# Patient Record
Sex: Male | Born: 1975 | Race: Black or African American | Hispanic: No | Marital: Single | Smoking: Current every day smoker
Health system: Southern US, Community
[De-identification: ages and names within clinical notes are randomized; demographics above are authoritative.]

## PROBLEM LIST (undated history)

## (undated) DIAGNOSIS — Z8616 Personal history of COVID-19: Secondary | ICD-10-CM

## (undated) DIAGNOSIS — F1120 Opioid dependence, uncomplicated: Secondary | ICD-10-CM

## (undated) DIAGNOSIS — J45909 Unspecified asthma, uncomplicated: Secondary | ICD-10-CM

## (undated) HISTORY — PX: NO PAST SURGERIES: SHX2092

---

## 2009-09-01 ENCOUNTER — Emergency Department: Payer: Self-pay | Admitting: Unknown Physician Specialty

## 2009-09-02 ENCOUNTER — Emergency Department: Payer: Self-pay | Admitting: Emergency Medicine

## 2010-12-03 ENCOUNTER — Emergency Department: Payer: Self-pay | Admitting: Emergency Medicine

## 2011-01-19 ENCOUNTER — Emergency Department: Payer: Self-pay | Admitting: *Deleted

## 2014-09-18 ENCOUNTER — Other Ambulatory Visit: Payer: Self-pay

## 2014-09-18 ENCOUNTER — Emergency Department
Admission: EM | Admit: 2014-09-18 | Discharge: 2014-09-18 | Disposition: A | Discharge status: Home / Self Care | Payer: Self-pay | Attending: Emergency Medicine | Admitting: Emergency Medicine

## 2014-09-18 ENCOUNTER — Emergency Department: Payer: Self-pay

## 2014-09-18 DIAGNOSIS — R109 Unspecified abdominal pain: Secondary | ICD-10-CM

## 2014-09-18 DIAGNOSIS — R100 Acute abdomen: Secondary | ICD-10-CM | POA: Insufficient documentation

## 2014-09-18 DIAGNOSIS — R001 Bradycardia, unspecified: Secondary | ICD-10-CM

## 2014-09-18 DIAGNOSIS — Z720 Tobacco use: Secondary | ICD-10-CM | POA: Insufficient documentation

## 2014-09-18 LAB — COMPREHENSIVE METABOLIC PANEL
ALT: 18 U/L (ref 17–63)
AST: 38 U/L (ref 15–41)
Albumin: 4.3 g/dL (ref 3.5–5.0)
Alkaline Phosphatase: 40 U/L (ref 38–126)
Anion gap: 7 (ref 5–15)
BUN: 11 mg/dL (ref 6–20)
CALCIUM: 9.2 mg/dL (ref 8.9–10.3)
CHLORIDE: 106 mmol/L (ref 101–111)
CO2: 27 mmol/L (ref 22–32)
Creatinine, Ser: 1.14 mg/dL (ref 0.61–1.24)
GFR calc non Af Amer: >60 mL/min (ref >60)
Glucose, Bld: 89 mg/dL (ref 65–99)
Potassium: 4 mmol/L (ref 3.5–5.1)
Sodium: 140 mmol/L (ref 135–145)
Total Bilirubin: 0.9 mg/dL (ref 0.3–1.2)
Total Protein: 7.5 g/dL (ref 6.5–8.1)

## 2014-09-18 LAB — CBC WITH DIFFERENTIAL/PLATELET
BASOS PCT: 0 %
Basophils Absolute: 0 10*3/uL (ref 0–0.1)
EOS ABS: 0.1 10*3/uL (ref 0–0.7)
EOS PCT: 1 %
HEMATOCRIT: 42 % (ref 40.0–52.0)
Hemoglobin: 13.8 g/dL (ref 13.0–18.0)
LYMPHS ABS: 2.2 10*3/uL (ref 1.0–3.6)
Lymphocytes Relative: 27 %
MCH: 28.4 pg (ref 26.0–34.0)
MCHC: 32.9 g/dL (ref 32.0–36.0)
MCV: 86.4 fL (ref 80.0–100.0)
Monocytes Absolute: 0.7 10*3/uL (ref 0.2–1.0)
Monocytes Relative: 9 %
Neutro Abs: 5.1 10*3/uL (ref 1.4–6.5)
Neutrophils Relative %: 63 %
Platelets: 163 10*3/uL (ref 150–440)
RBC: 4.86 MIL/uL (ref 4.40–5.90)
RDW: 13.8 % (ref 11.5–14.5)
WBC: 8.1 10*3/uL (ref 3.8–10.6)

## 2014-09-18 LAB — URINALYSIS COMPLETE WITH MICROSCOPIC (ARMC ONLY)
Bacteria, UA: NONE SEEN
Bilirubin Urine: NEGATIVE
Glucose, UA: NEGATIVE mg/dL
KETONES UR: NEGATIVE mg/dL
Nitrite: NEGATIVE
Protein, ur: NEGATIVE mg/dL
Specific Gravity, Urine: 1.014 (ref 1.005–1.030)
Squamous Epithelial / LPF: NONE SEEN
pH: 6 (ref 5.0–8.0)

## 2014-09-18 LAB — LIPASE, BLOOD: LIPASE: 19 U/L — AB (ref 22–51)

## 2014-09-18 MED ORDER — METRONIDAZOLE 500 MG PO TABS: 500.0000 mg | ORAL_TABLET | Freq: Three times a day (TID) | ORAL | Status: DC

## 2014-09-18 MED ORDER — DICYCLOMINE HCL 20 MG PO TABS: 9.0000 mg | ORAL_TABLET | Freq: Three times a day (TID) | ORAL | Status: DC | PRN

## 2014-09-18 NOTE — ED Provider Notes (Addendum)
[facility] Emergency Department Provider Note     Time seen: ----------------------------------------- 12:13 PM on 09/18/2014 -----------------------------------------    I have reviewed the triage vital signs and the nursing notes.   HISTORY  Chief Complaint Abdominal Pain; Constipation; and Dysuria    HPI Russell Harrington. is a 39 y.o. male who presents ER with abdominal pain, states that yesterday started feeling constipated and he had a mucousy-like stool. States every time he goes to the bathroom he sees mucus or jelly like substance. He combines of some mild dull left sided abdominal pain, nothing makes better worse. No history of same, prior to this he has had normal bowel movements. Denies fevers chills or other complaints.   History reviewed. No pertinent past medical history.  There are no active problems to display for this patient.   History reviewed. No pertinent past surgical history.  Allergies Aspirin  Social History History  Substance Use Topics  . Smoking status: Current Every Day Smoker  . Smokeless tobacco: Not on file  . Alcohol Use: Yes    Review of Systems Constitutional: Negative for fever. Eyes: Negative for visual changes. ENT: Negative for sore throat. Cardiovascular: Negative for chest pain. Respiratory: Negative for shortness of breath. Gastrointestinal: Positive for abdominal pain, questionable diarrhea Genitourinary: Negative for dysuria. Musculoskeletal: Negative for back pain. Skin: Negative for rash. Neurological: Negative for headaches, focal weakness or numbness.  10-point ROS otherwise negative.  ____________________________________________   PHYSICAL EXAM:  VITAL SIGNS: ED Triage Vitals  Enc Vitals Group     BP 09/18/14 1053 135/90 mmHg     Pulse Rate 09/18/14 1053 50     Resp 09/18/14 1053 18     Temp 09/18/14 1053 98.2 F (36.8 C)     Temp Source 09/18/14 1053 Oral     SpO2 09/18/14  1053 100 %     Weight --      Height --      Head Cir --      Peak Flow --      Pain Score --      Pain Loc --      Pain Edu? --      Excl. in GC? --     Constitutional: Alert and oriented. Well appearing and in no distress. Eyes: Conjunctivae are normal. PERRL. Normal extraocular movements. ENT   Head: Normocephalic and atraumatic.   Nose: No congestion/rhinnorhea.   Mouth/Throat: Mucous membranes are moist.   Neck: No stridor. Hematological/Lymphatic/Immunilogical: No cervical lymphadenopathy. Cardiovascular: Normal rate, regular rhythm. Normal and symmetric distal pulses are present in all extremities. No murmurs, rubs, or gallops. Respiratory: Normal respiratory effort without tachypnea nor retractions. Breath sounds are clear and equal bilaterally. No wheezes/rales/rhonchi. Gastrointestinal: Soft and nontender. No distention. No abdominal bruits. There is no CVA tenderness. Musculoskeletal: Nontender with normal range of motion in all extremities. No joint effusions.  No lower extremity tenderness nor edema. Neurologic:  Normal speech and language. No gross focal neurologic deficits are appreciated. Speech is normal. No gait instability. Skin:  Skin is warm, dry and intact. No rash noted. Psychiatric: Mood and affect are normal. Speech and behavior are normal. Patient exhibits appropriate insight and judgment.  ____________________________________________  ED COURSE:  Pertinent labs & imaging results that were available during my care of the patient were reviewed by me and considered in my medical decision making (see chart for details). Patient looks well, he is in no acute distress. We'll check abdominal labs and plain films.  ____________________________________________    LABS (pertinent positives/negatives)  Labs Reviewed  LIPASE, BLOOD - Abnormal; Notable for the following:    Lipase 19 (*)    All other components within normal limits  URINALYSIS  COMPLETEWITH MICROSCOPIC ([facility] ONLY) - Abnormal; Notable for the following:    Color, Urine YELLOW (*)    APPearance CLEAR (*)    Hgb urine dipstick 1+ (*)    Leukocytes, UA 1+ (*)    All other components within normal limits  STOOL CULTURE  CBC WITH DIFFERENTIAL/PLATELET  COMPREHENSIVE METABOLIC PANEL   EKG: Sinus bradycardia with a rate of 38 bpm, early repolarization. Normal axis. Normal intervals. No evidence of acute infarction.  RADIOLOGY Images were viewed by me  Two-view abdomen FINDINGS: Scattered large and small bowel gas is noted. No free air is seen. Fecal material is noted throughout the colon. No abnormal mass or abnormal calcifications are noted. Phleboliths are seen within the pelvis. No acute bony abnormality is noted.  IMPRESSION: No acute abnormality seen. ____________________________________________  FINAL ASSESSMENT AND PLAN  Abdominal pain, bradycardia  Plan: Patient with labs and imaging as dictated above. Patient had a normal looking stool here, but we'll place him on Flagyl and encouraged close follow-up with a physician. His also noted to have bradycardia that is sinus bradycardia and he is asymptomatic from. Patient states he can exercise vigorously for 12 hours at a time and he doesn't have any dyspnea or chest pain.   Emily Filbert, MD   Emily Filbert, MD 09/18/14 1410  Emily Filbert, MD 09/18/14 [phone]

## 2014-09-18 NOTE — ED Notes (Signed)
Pt states that he is experiencing lower abdominal pain when he tries to urinate or defecate that began yesterday. States pain has "eased" off at this time. Constipation and dysuria. Pt alert and oriented X4, active, cooperative, pt in NAD. RR even and unlabored, color WNL.

## 2014-09-18 NOTE — Discharge Instructions (Signed)

## 2014-09-18 NOTE — ED Notes (Signed)
Pt states discomfort in his abdomen, pt in no distress, pt states yesterday he felt constipated and states "jelly" kept coming out, pt states last night he had a BM that was brown but weird in texture, no distended abdomen, pt denies any painful urination

## 2014-09-18 NOTE — ED Notes (Signed)
Patient transported to X-ray 

## 2014-09-20 LAB — STOOL CULTURE: Special Requests: NORMAL

## 2015-04-02 ENCOUNTER — Encounter: Payer: Self-pay | Admitting: Emergency Medicine

## 2015-04-02 ENCOUNTER — Emergency Department
Admission: EM | Admit: 2015-04-02 | Discharge: 2015-04-02 | Disposition: A | Discharge status: Home / Self Care | Payer: No Typology Code available for payment source | Attending: Emergency Medicine | Admitting: Emergency Medicine

## 2015-04-02 DIAGNOSIS — Z792 Long term (current) use of antibiotics: Secondary | ICD-10-CM | POA: Diagnosis not present

## 2015-04-02 DIAGNOSIS — Y9389 Activity, other specified: Secondary | ICD-10-CM | POA: Insufficient documentation

## 2015-04-02 DIAGNOSIS — S3992XA Unspecified injury of lower back, initial encounter: Secondary | ICD-10-CM | POA: Insufficient documentation

## 2015-04-02 DIAGNOSIS — Y998 Other external cause status: Secondary | ICD-10-CM | POA: Diagnosis not present

## 2015-04-02 DIAGNOSIS — Y92481 Parking lot as the place of occurrence of the external cause: Secondary | ICD-10-CM | POA: Diagnosis not present

## 2015-04-02 DIAGNOSIS — F17200 Nicotine dependence, unspecified, uncomplicated: Secondary | ICD-10-CM | POA: Diagnosis not present

## 2015-04-02 DIAGNOSIS — M545 Low back pain, unspecified: Secondary | ICD-10-CM

## 2015-04-02 MED ORDER — IBUPROFEN 800 MG PO TABS
800.0000 mg | ORAL_TABLET | Freq: Once | ORAL | Status: AC
  Administered 2015-04-02: 800 mg via ORAL
  Filled 2015-04-02: qty 1

## 2015-04-02 MED ORDER — TRAMADOL HCL 50 MG PO TABS
50.0000 mg | ORAL_TABLET | Freq: Once | ORAL | Status: AC
  Administered 2015-04-02: 50 mg via ORAL
  Filled 2015-04-02: qty 1

## 2015-04-02 MED ORDER — TRAMADOL HCL 50 MG PO TABS: 50.0000 mg | ORAL_TABLET | Freq: Four times a day (QID) | ORAL | Status: DC | PRN

## 2015-04-02 MED ORDER — IBUPROFEN 800 MG PO TABS: 800.0000 mg | ORAL_TABLET | Freq: Three times a day (TID) | ORAL | Status: DC | PRN

## 2015-04-02 NOTE — Discharge Instructions (Signed)
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
## 2015-05-04 ENCOUNTER — Emergency Department: Payer: No Typology Code available for payment source

## 2015-05-04 ENCOUNTER — Emergency Department
Admission: EM | Admit: 2015-05-04 | Discharge: 2015-05-04 | Disposition: A | Discharge status: Home / Self Care | Payer: No Typology Code available for payment source | Attending: Emergency Medicine | Admitting: Emergency Medicine

## 2015-05-04 DIAGNOSIS — Z792 Long term (current) use of antibiotics: Secondary | ICD-10-CM | POA: Insufficient documentation

## 2015-05-04 DIAGNOSIS — B349 Viral infection, unspecified: Secondary | ICD-10-CM | POA: Insufficient documentation

## 2015-05-04 DIAGNOSIS — A084 Viral intestinal infection, unspecified: Secondary | ICD-10-CM | POA: Insufficient documentation

## 2015-05-04 DIAGNOSIS — F17200 Nicotine dependence, unspecified, uncomplicated: Secondary | ICD-10-CM | POA: Insufficient documentation

## 2015-05-04 LAB — COMPREHENSIVE METABOLIC PANEL
ALBUMIN: 4.6 g/dL (ref 3.5–5.0)
ALT: 14 U/L — ABNORMAL LOW (ref 17–63)
AST: 22 U/L (ref 15–41)
Alkaline Phosphatase: 54 U/L (ref 38–126)
Anion gap: 8 (ref 5–15)
BUN: 12 mg/dL (ref 6–20)
CHLORIDE: 106 mmol/L (ref 101–111)
CO2: 25 mmol/L (ref 22–32)
Calcium: 9.4 mg/dL (ref 8.9–10.3)
Creatinine, Ser: 1.12 mg/dL (ref 0.61–1.24)
GFR calc Af Amer: >60 mL/min (ref >60)
GFR calc non Af Amer: >60 mL/min (ref >60)
GLUCOSE: 106 mg/dL — AB (ref 65–99)
POTASSIUM: 4.2 mmol/L (ref 3.5–5.1)
Sodium: 139 mmol/L (ref 135–145)
TOTAL PROTEIN: 8.3 g/dL — AB (ref 6.5–8.1)
Total Bilirubin: 1.2 mg/dL (ref 0.3–1.2)

## 2015-05-04 LAB — RAPID INFLUENZA A&B ANTIGENS (ARMC ONLY): INFLUENZA A (ARMC): NEGATIVE

## 2015-05-04 LAB — CBC
HEMATOCRIT: 46.6 % (ref 40.0–52.0)
HEMOGLOBIN: 15.3 g/dL (ref 13.0–18.0)
MCH: 27.6 pg (ref 26.0–34.0)
MCHC: 32.8 g/dL (ref 32.0–36.0)
MCV: 84.2 fL (ref 80.0–100.0)
Platelets: 192 10*3/uL (ref 150–440)
RBC: 5.53 MIL/uL (ref 4.40–5.90)
RDW: 13.8 % (ref 11.5–14.5)
WBC: 12.6 10*3/uL — ABNORMAL HIGH (ref 3.8–10.6)

## 2015-05-04 LAB — TROPONIN I: Troponin I: <0.03 ng/mL (ref <0.031)

## 2015-05-04 LAB — RAPID INFLUENZA A&B ANTIGENS: Influenza B (ARMC): NEGATIVE

## 2015-05-04 MED ORDER — ONDANSETRON HCL 4 MG/2ML IJ SOLN
4.0000 mg | Freq: Once | INTRAMUSCULAR | Status: AC
  Administered 2015-05-04: 4 mg via INTRAVENOUS
  Filled 2015-05-04: qty 2

## 2015-05-04 MED ORDER — HYDROMORPHONE HCL 1 MG/ML IJ SOLN
0.5000 mg | Freq: Once | INTRAMUSCULAR | Status: AC
  Administered 2015-05-04: 0.5 mg via INTRAVENOUS
  Filled 2015-05-04: qty 1

## 2015-05-04 MED ORDER — DICYCLOMINE HCL 20 MG PO TABS: 20.0000 mg | ORAL_TABLET | Freq: Three times a day (TID) | ORAL | Status: DC | PRN

## 2015-05-04 MED ORDER — PROMETHAZINE HCL 12.5 MG PO TABS: 12.5000 mg | ORAL_TABLET | Freq: Four times a day (QID) | ORAL | Status: DC | PRN

## 2015-05-04 MED ORDER — SODIUM CHLORIDE 0.9 % IV BOLUS (SEPSIS)
1000.0000 mL | Freq: Once | INTRAVENOUS | Status: AC
  Administered 2015-05-04: 1000 mL via INTRAVENOUS

## 2015-05-04 NOTE — ED Notes (Signed)
Pt arrives to ER via POV c/o URI sx, nasal congestion, chest congestion, diarrhea and chills. Pt states worsening sx today. Pt did not get flu shot this year. Pt states he knows a few people who have been sick recently. Pt alert and oriented X4, active, cooperative, pt in NAD. RR even and unlabored, color WNL.

## 2015-05-04 NOTE — ED Provider Notes (Signed)
Time Seen: Approximately 2128  I have reviewed the triage notes  Chief Complaint: Nasal Congestion; Diarrhea; URI; Chills; and Shortness of Breath   History of Present Illness: Russell MoraleBarney Turnipseed Jr. is a 40 y.o. male who complains of multiple symptoms mainly starting with some upper respiratory symptoms that started over the last 24 hours with nasal and chest congestion. Patient states he came to the emergency department because he had some generalized weakness after he had multiple loose watery stool. She is not aware of any high fevers or any bloody diarrhea. He states that he has not had any productive nature to a cough. He still feels nauseated. He is denies any recent travel or antibiotic therapy.   History reviewed. No pertinent past medical history.  There are no active problems to display for this patient.   History reviewed. No pertinent past surgical history.  History reviewed. No pertinent past surgical history.  Current Outpatient Rx  Name  Route  Sig  Dispense  Refill  . dicyclomine (BENTYL) 20 MG tablet   Oral   Take 1 tablet (20 mg total) by mouth 3 (three) times daily as needed for spasms.   30 tablet   0   . ibuprofen (ADVIL,MOTRIN) 800 MG tablet   Oral   Take 1 tablet (800 mg total) by mouth every 8 (eight) hours as needed.   30 tablet   0   . metroNIDAZOLE (FLAGYL) 500 MG tablet   Oral   Take 1 tablet (500 mg total) by mouth 3 (three) times daily.   21 tablet   0   . promethazine (PHENERGAN) 12.5 MG tablet   Oral   Take 1 tablet (12.5 mg total) by mouth every 6 (six) hours as needed for nausea or vomiting.   30 tablet   0   . traMADol (ULTRAM) 50 MG tablet   Oral   Take 1 tablet (50 mg total) by mouth every 6 (six) hours as needed for moderate pain.   12 tablet   0     Allergies:  Aspirin  Family History: No family history on file.  Social History: Social History  Substance Use Topics  . Smoking status: Current Every Day Smoker  .  Smokeless tobacco: None  . Alcohol Use: Yes     Review of Systems:   10 point review of systems was performed and was otherwise negative:  Constitutional: No fever Eyes: No visual disturbances ENT: No sore throat, ear pain Cardiac: Mild chest discomfort mainly with cough and points mainly around the sternal region Respiratory: No shortness of breath, wheezing, or stridor Abdomen: Diffuse crampy abdominal pain and points primarily to the umbilicus region. Endocrine: No weight loss, No night sweats Extremities: No peripheral edema, cyanosis Skin: No rashes, easy bruising Neurologic: No focal weakness, trouble with speech or swollowing Urologic: No dysuria, Hematuria, or urinary frequency   Physical Exam:  ED Triage Vitals  Enc Vitals Group     BP 05/04/15 2102 132/72 mmHg     Pulse Rate 05/04/15 2102 71     Resp 05/04/15 2102 18     Temp 05/04/15 2102 98.6 F (37 C)     Temp Source 05/04/15 2102 Oral     SpO2 05/04/15 2102 100 %     Weight 05/04/15 2102 170 lb (77.111 kg)     Height 05/04/15 2102 5\' 7"  (1.702 m)     Head Cir --      Peak Flow --  Pain Score 05/04/15 2103 10     Pain Loc --      Pain Edu? --      Excl. in GC? --     General: Awake , Alert , and Oriented times 3; GCS 15 Head: Normal cephalic , atraumatic Eyes: Pupils equal , round, reactive to light Nose/Throat: No nasal drainage, patent upper airway without erythema or exudate.  Neck: Supple, Full range of motion, No anterior adenopathy or palpable thyroid masses Lungs: Clear to ascultation without wheezes , rhonchi, or rales Heart: Regular rate, regular rhythm without murmurs , gallops , or rubs Abdomen: Very mild. Umbilical pain without without rebound, guarding , or rigidity; bowel sounds positive and symmetric in all 4 quadrants. No organomegaly .     No focal tenderness over McBurney's point   Extremities: 2 plus symmetric pulses. No edema, clubbing or cyanosis Neurologic: normal ambulation,  Motor symmetric without deficits, sensory intact Skin: warm, dry, no rashes   Labs:   All laboratory work was reviewed including any pertinent negatives or positives listed below:  Labs Reviewed  COMPREHENSIVE METABOLIC PANEL - Abnormal; Notable for the following:    Glucose, Bld 106 (*)    Total Protein 8.3 (*)    ALT 14 (*)    All other components within normal limits  CBC - Abnormal; Notable for the following:    WBC 12.6 (*)    All other components within normal limits  RAPID INFLUENZA A&B ANTIGENS ([facility] ONLY)  TROPONIN I   reviewed the patient's laboratory work showed no significant findings  EKG:  ED ECG REPORT I, Jennye Moccasin, the attending physician, personally viewed and interpreted this ECG.  Date: 05/04/2015 EKG Time: 2109 Rate: 60 Rhythm: normal sinus rhythm with early repolarization QRS Axis: normal Intervals: normal ST/T Wave abnormalities: normal Conduction Disturbances: none Narrative Interpretation: unremarkable No acute ischemic changes chills for 4 days. Shortness of breath.  EXAM: CHEST 2 VIEW  COMPARISON: None.  FINDINGS: The heart size and mediastinal contours are within normal limits. Both lungs are clear. The visualized skeletal structures are unremarkable.  IMPRESSION: No active cardiopulmonary disease.  Radiology:   I personally reviewed the radiologic studies   ED Course:  Patient's stay here was uneventful he was given IV fluid bolus along with small dose of Dilaudid and IV Zofran. Patient was able tolerate oral fluids. His only current symptoms seems to be some persistent loose watery stool with diffuse abdominal intermittent cramping pain. I felt was unlikely the patient had a surgical abdomen given his symptoms and this seems to be some form of viral illness at this time. I found it was unlikely to be acute appendicitis is repeat exam shows resolution of his abdominal pain is up and able to forward without difficulty. I also  felt was viral in nature given the widespread symptoms of nasal congestion, chest congestion, etc.    Assessment:  Viral gastroenteritis   Final Clinical Impression:  Final diagnoses:  Viral syndrome     Plan:  Outpatient management Patient was advised to return immediately if condition worsens. Patient was advised to follow up with their primary care physician or other specialized physicians involved in their outpatient care  Patient was prescribed Bentyl and Zofran on an outpatient basis          Jennye Moccasin, MD 05/04/15 2340

## 2015-05-04 NOTE — ED Notes (Signed)
Pt to triage room, has to go to bathroom immediately. States he has diarrhea

## 2017-08-21 ENCOUNTER — Emergency Department: Payer: Self-pay

## 2017-08-21 ENCOUNTER — Other Ambulatory Visit: Payer: Self-pay

## 2017-08-21 ENCOUNTER — Emergency Department
Admission: EM | Admit: 2017-08-21 | Discharge: 2017-08-21 | Disposition: A | Discharge status: Home / Self Care | Payer: Self-pay | Attending: Emergency Medicine | Admitting: Emergency Medicine

## 2017-08-21 DIAGNOSIS — S060X1A Concussion with loss of consciousness of 30 minutes or less, initial encounter: Secondary | ICD-10-CM | POA: Insufficient documentation

## 2017-08-21 DIAGNOSIS — Y929 Unspecified place or not applicable: Secondary | ICD-10-CM | POA: Insufficient documentation

## 2017-08-21 DIAGNOSIS — F17200 Nicotine dependence, unspecified, uncomplicated: Secondary | ICD-10-CM | POA: Insufficient documentation

## 2017-08-21 DIAGNOSIS — H209 Unspecified iridocyclitis: Secondary | ICD-10-CM | POA: Insufficient documentation

## 2017-08-21 DIAGNOSIS — Y939 Activity, unspecified: Secondary | ICD-10-CM | POA: Insufficient documentation

## 2017-08-21 DIAGNOSIS — Y999 Unspecified external cause status: Secondary | ICD-10-CM | POA: Insufficient documentation

## 2017-08-21 MED ORDER — OXYCODONE-ACETAMINOPHEN 5-325 MG PO TABS: 1.0000 | ORAL_TABLET | ORAL | 0 refills | Status: DC | PRN

## 2017-08-21 MED ORDER — GABAPENTIN 300 MG PO CAPS
900.0000 mg | ORAL_CAPSULE | Freq: Once | ORAL | Status: DC
Start: 2017-08-21 — End: 2017-08-21
  Filled 2017-08-21: qty 3

## 2017-08-21 MED ORDER — TETANUS-DIPHTH-ACELL PERTUSSIS 5-2.5-18.5 LF-MCG/0.5 IM SUSP
0.5000 mL | Freq: Once | INTRAMUSCULAR | Status: DC
  Filled 2017-08-21: qty 0.5

## 2017-08-21 MED ORDER — TETRACAINE HCL 0.5 % OP SOLN
2.0000 [drp] | Freq: Once | OPHTHALMIC | Status: DC
Start: 2017-08-21 — End: 2017-08-21
  Filled 2017-08-21: qty 4

## 2017-08-21 MED ORDER — GABAPENTIN 600 MG PO TABS
900.0000 mg | ORAL_TABLET | Freq: Once | ORAL | Status: AC
  Administered 2017-08-21: 900 mg via ORAL
  Filled 2017-08-21: qty 2

## 2017-08-21 MED ORDER — OXYCODONE-ACETAMINOPHEN 5-325 MG PO TABS
2.0000 | ORAL_TABLET | Freq: Once | ORAL | Status: AC
  Administered 2017-08-21: 2 via ORAL
  Filled 2017-08-21: qty 2

## 2017-08-21 MED ORDER — IBUPROFEN 600 MG PO TABS
600.0000 mg | ORAL_TABLET | Freq: Once | ORAL | Status: AC
  Administered 2017-08-21: 600 mg via ORAL
  Filled 2017-08-21: qty 1

## 2017-08-21 MED ORDER — FLUORESCEIN SODIUM 1 MG OP STRP
1.0000 | ORAL_STRIP | Freq: Once | OPHTHALMIC | Status: DC
  Filled 2017-08-21: qty 1

## 2017-08-21 MED ORDER — PREDNISOLONE ACETATE 1 % OP SUSP: 1.0000 [drp] | Freq: Four times a day (QID) | OPHTHALMIC | 0 refills | Status: AC

## 2017-08-21 NOTE — ED Notes (Signed)
Attempting to assess pt but pt keeps answering telephone and interrupting assessment.

## 2017-08-21 NOTE — ED Notes (Signed)
Waiting on gabapentin from pharmacy.

## 2017-08-21 NOTE — ED Triage Notes (Signed)
Of note, patient cannot/will not sit still for blood pressure cuff to finish pumping up.
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
## 2017-08-21 NOTE — ED Notes (Signed)
Pt in ct 

## 2017-08-21 NOTE — ED Notes (Signed)
Pt complains of left sided rib pain and left arm pain from wrist to shoulder.
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
## 2017-12-28 ENCOUNTER — Inpatient Hospital Stay
Admission: RE | Admit: 2017-12-28 | Discharge: 2017-12-30 | DRG: 885 | Disposition: A | Discharge status: Home / Self Care | Payer: No Typology Code available for payment source | Source: Intra-hospital | Attending: Psychiatry | Admitting: Psychiatry

## 2017-12-28 ENCOUNTER — Emergency Department
Admission: EM | Admit: 2017-12-28 | Discharge: 2017-12-28 | Disposition: A | Discharge status: Other Acute Inpatient Hospital | Payer: Self-pay | Attending: Student in an Organized Health Care Education/Training Program | Admitting: Student in an Organized Health Care Education/Training Program

## 2017-12-28 ENCOUNTER — Other Ambulatory Visit: Payer: Self-pay

## 2017-12-28 DIAGNOSIS — R45851 Suicidal ideations: Secondary | ICD-10-CM | POA: Diagnosis present

## 2017-12-28 DIAGNOSIS — Z791 Long term (current) use of non-steroidal anti-inflammatories (NSAID): Secondary | ICD-10-CM

## 2017-12-28 DIAGNOSIS — F1120 Opioid dependence, uncomplicated: Secondary | ICD-10-CM | POA: Diagnosis present

## 2017-12-28 DIAGNOSIS — F17200 Nicotine dependence, unspecified, uncomplicated: Secondary | ICD-10-CM | POA: Diagnosis present

## 2017-12-28 DIAGNOSIS — F1220 Cannabis dependence, uncomplicated: Secondary | ICD-10-CM | POA: Diagnosis present

## 2017-12-28 DIAGNOSIS — F1910 Other psychoactive substance abuse, uncomplicated: Secondary | ICD-10-CM | POA: Insufficient documentation

## 2017-12-28 DIAGNOSIS — Z886 Allergy status to analgesic agent status: Secondary | ICD-10-CM

## 2017-12-28 DIAGNOSIS — F332 Major depressive disorder, recurrent severe without psychotic features: Secondary | ICD-10-CM | POA: Diagnosis present

## 2017-12-28 DIAGNOSIS — F329 Major depressive disorder, single episode, unspecified: Secondary | ICD-10-CM

## 2017-12-28 DIAGNOSIS — Z79899 Other long term (current) drug therapy: Secondary | ICD-10-CM | POA: Insufficient documentation

## 2017-12-28 DIAGNOSIS — Z716 Tobacco abuse counseling: Secondary | ICD-10-CM

## 2017-12-28 DIAGNOSIS — Z560 Unemployment, unspecified: Secondary | ICD-10-CM

## 2017-12-28 DIAGNOSIS — F1420 Cocaine dependence, uncomplicated: Secondary | ICD-10-CM | POA: Diagnosis present

## 2017-12-28 DIAGNOSIS — F1320 Sedative, hypnotic or anxiolytic dependence, uncomplicated: Secondary | ICD-10-CM | POA: Diagnosis present

## 2017-12-28 DIAGNOSIS — Z590 Homelessness: Secondary | ICD-10-CM

## 2017-12-28 DIAGNOSIS — F1110 Opioid abuse, uncomplicated: Secondary | ICD-10-CM | POA: Insufficient documentation

## 2017-12-28 DIAGNOSIS — F1994 Other psychoactive substance use, unspecified with psychoactive substance-induced mood disorder: Secondary | ICD-10-CM

## 2017-12-28 DIAGNOSIS — F32A Depression, unspecified: Secondary | ICD-10-CM

## 2017-12-28 DIAGNOSIS — D72829 Elevated white blood cell count, unspecified: Secondary | ICD-10-CM | POA: Diagnosis present

## 2017-12-28 DIAGNOSIS — F1914 Other psychoactive substance abuse with psychoactive substance-induced mood disorder: Secondary | ICD-10-CM | POA: Insufficient documentation

## 2017-12-28 LAB — URINALYSIS, COMPLETE (UACMP) WITH MICROSCOPIC
BACTERIA UA: NONE SEEN
Bilirubin Urine: NEGATIVE
Glucose, UA: NEGATIVE mg/dL
Hgb urine dipstick: NEGATIVE
KETONES UR: NEGATIVE mg/dL
Leukocytes, UA: NEGATIVE
Nitrite: NEGATIVE
PROTEIN: NEGATIVE mg/dL
Specific Gravity, Urine: 1.012 (ref 1.005–1.030)
Squamous Epithelial / LPF: NONE SEEN (ref 0–5)
pH: 5 (ref 5.0–8.0)

## 2017-12-28 LAB — COMPREHENSIVE METABOLIC PANEL
ALBUMIN: 4.2 g/dL (ref 3.5–5.0)
ALK PHOS: 35 U/L — AB (ref 38–126)
ALT: 20 U/L (ref 0–44)
ANION GAP: 11 (ref 5–15)
AST: 28 U/L (ref 15–41)
BILIRUBIN TOTAL: 1.6 mg/dL — AB (ref 0.3–1.2)
BUN: 10 mg/dL (ref 6–20)
CALCIUM: 9.1 mg/dL (ref 8.9–10.3)
CO2: 27 mmol/L (ref 22–32)
CREATININE: 1.27 mg/dL — AB (ref 0.61–1.24)
Chloride: 102 mmol/L (ref 98–111)
GFR calc Af Amer: >60 mL/min (ref >60)
GFR calc non Af Amer: >60 mL/min (ref >60)
Glucose, Bld: 114 mg/dL — ABNORMAL HIGH (ref 70–99)
Potassium: 3.7 mmol/L (ref 3.5–5.1)
Sodium: 140 mmol/L (ref 135–145)
TOTAL PROTEIN: 7 g/dL (ref 6.5–8.1)

## 2017-12-28 LAB — URINE DRUG SCREEN, QUALITATIVE (ARMC ONLY)
AMPHETAMINES, UR SCREEN: NOT DETECTED
Barbiturates, Ur Screen: NOT DETECTED
Benzodiazepine, Ur Scrn: POSITIVE — AB
Cannabinoid 50 Ng, Ur ~~LOC~~: POSITIVE — AB
Cocaine Metabolite,Ur ~~LOC~~: POSITIVE — AB
MDMA (ECSTASY) UR SCREEN: NOT DETECTED
Methadone Scn, Ur: NOT DETECTED
OPIATE, UR SCREEN: POSITIVE — AB
PHENCYCLIDINE (PCP) UR S: NOT DETECTED
Tricyclic, Ur Screen: NOT DETECTED

## 2017-12-28 LAB — CBC
HEMATOCRIT: 41.4 % (ref 39.0–52.0)
HEMOGLOBIN: 14.3 g/dL (ref 13.0–17.0)
MCH: 29.6 pg (ref 26.0–34.0)
MCHC: 34.5 g/dL (ref 30.0–36.0)
MCV: 85.7 fL (ref 80.0–100.0)
Platelets: 291 10*3/uL (ref 150–400)
RBC: 4.83 MIL/uL (ref 4.22–5.81)
RDW: 13.4 % (ref 11.5–15.5)
WBC: 23.9 10*3/uL — ABNORMAL HIGH (ref 4.0–10.5)
nRBC: 0 % (ref 0.0–0.2)

## 2017-12-28 LAB — ETHANOL: Alcohol, Ethyl (B): <10 mg/dL (ref <10)

## 2017-12-28 LAB — ACETAMINOPHEN LEVEL

## 2017-12-28 LAB — SALICYLATE LEVEL: Salicylate Lvl: <7 mg/dL (ref 2.8–30.0)

## 2017-12-28 MED ORDER — MIDAZOLAM HCL 5 MG/5ML IJ SOLN
INTRAMUSCULAR | Status: AC
  Filled 2017-12-28: qty 5

## 2017-12-28 MED ORDER — DIPHENHYDRAMINE HCL 50 MG/ML IJ SOLN
INTRAMUSCULAR | Status: AC
  Filled 2017-12-28: qty 1

## 2017-12-28 MED ORDER — ALUM & MAG HYDROXIDE-SIMETH 200-200-20 MG/5ML PO SUSP: 30.0000 mL | ORAL | Status: DC | PRN

## 2017-12-28 MED ORDER — MIDAZOLAM HCL 2 MG/2ML IJ SOLN
2.0000 mg | Freq: Once | INTRAMUSCULAR | Status: AC
  Administered 2017-12-28: 2 mg via INTRAMUSCULAR
  Filled 2017-12-28: qty 2

## 2017-12-28 MED ORDER — NICOTINE 21 MG/24HR TD PT24
21.0000 mg | MEDICATED_PATCH | Freq: Once | TRANSDERMAL | Status: DC
Start: 2017-12-28 — End: 2017-12-28
  Administered 2017-12-28: 21 mg via TRANSDERMAL
  Filled 2017-12-28: qty 1

## 2017-12-28 MED ORDER — TRAZODONE HCL 100 MG PO TABS
100.0000 mg | ORAL_TABLET | Freq: Every evening | ORAL | Status: DC | PRN
  Administered 2017-12-28: 100 mg via ORAL
  Filled 2017-12-28 (×2): qty 1

## 2017-12-28 MED ORDER — MAGNESIUM HYDROXIDE 400 MG/5ML PO SUSP: 30.0000 mL | Freq: Every day | ORAL | Status: DC | PRN

## 2017-12-28 MED ORDER — DIPHENHYDRAMINE HCL 50 MG/ML IJ SOLN
25.0000 mg | INTRAMUSCULAR | Status: AC
  Administered 2017-12-28: 25 mg via INTRAMUSCULAR

## 2017-12-28 MED ORDER — ACETAMINOPHEN 325 MG PO TABS: 650.0000 mg | ORAL_TABLET | Freq: Four times a day (QID) | ORAL | Status: DC | PRN

## 2017-12-28 MED ORDER — HYDROXYZINE HCL 50 MG PO TABS: 50.0000 mg | ORAL_TABLET | Freq: Three times a day (TID) | ORAL | Status: DC | PRN

## 2017-12-28 MED ORDER — OLANZAPINE 10 MG PO TABS
10.0000 mg | ORAL_TABLET | ORAL | Status: AC
  Administered 2017-12-28: 10 mg via ORAL
  Filled 2017-12-28: qty 1

## 2017-12-28 NOTE — ED Notes (Signed)
BEHAVIORAL HEALTH ROUNDING Patient sleeping: Yes.   Patient alert and oriented: eyes closed  Appears to be asleep Behavior appropriate: Yes.  ; If no, describe:  Nutrition and fluids offered: Yes  Toileting and hygiene offered: sleeping Sitter present: q 15 minute observations and security monitoring Law enforcement present: yes  ODS 

## 2017-12-28 NOTE — ED Notes (Signed)
Pt is alert and oriented.  Reports took drugs trying to kill himself.  When asked what he took pt states "cocaine, heroine, whatever I can find".  Dressed out by this Engineer, site (1 tshirt, 1 black pants, 1 boxers, 1 pair shoes/socks, 1 cell phone).
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
## 2017-12-28 NOTE — ED Notes (Signed)
Pt agitated and pacing. Keeps grabbing head and legs. Thoughts/statements seem scattered and to not make sense despite being oriented.  Tried to redirect pt to bed multiple times. Dr York Cerise notified of increasing agitation

## 2017-12-28 NOTE — ED Notes (Signed)
Pt rolling around on floor kicking wall.  Dr York Cerise notified and at bedside.

## 2017-12-28 NOTE — ED Notes (Signed)
Pt currently in bed with rails up X 4 for safety.

## 2017-12-28 NOTE — BH Assessment (Signed)
Patient is to be admitted to [facility] by Dr. Toni Amend.  Attending Physician will be Dr. Jennet Maduro.   Patient has been assigned to room 306-B, by [facility] Charge Nurse Lillette Boxer.   Intake Paper Work has been signed and placed on patient chart.  ER staff is aware of the admission:  Irving Burton, ER Secretary    Dr. Darnelle Catalan, ER MD   Amy T., Patient's Nurse   Ethelene Browns, Patient Access.

## 2017-12-28 NOTE — ED Notes (Signed)
Patient observed lying in bed with eyes closed  Even, unlabored respirations observed   NAD pt appears to be sleeping  I will continue to monitor along with every 15 minute visual observations and ongoing security monitoring    

## 2017-12-28 NOTE — ED Notes (Signed)
BEHAVIORAL HEALTH ROUNDING Patient sleeping: No. Patient alert and oriented: yes Behavior appropriate: Yes.  ; If no, describe:  Nutrition and fluids offered: yes Toileting and hygiene offered: Yes  Sitter present: q15 minute observations and security  monitoring Law enforcement present: Yes  ODS  

## 2017-12-28 NOTE — BH Assessment (Signed)
Assessment Note  Russell Harrington. is an 42 y.o. male who presents to the ED with an altered mental status due to excessive substance use. Per pt RN: "Pt rolling around on floor kicking wall.  Dr York Cerise notified and at bedside." Pt given IM medications and is asleep. TTS will perform assessment when pt is appropriate.   Diagnosis: Altered Mental Status  Past Medical History: History reviewed. No pertinent past medical history.  History reviewed. No pertinent surgical history.  Family History: No family history on file.  Social History:  reports that he has been smoking. He has never used smokeless tobacco. He reports that he drinks alcohol. He reports that he has current or past drug history.  Additional Social History:     CIWA: CIWA-Ar BP: 127/82 Pulse Rate: 73 COWS:    Allergies:  Allergies  Allergen Reactions  . Aspirin     Home Medications:  (Not in a hospital admission)  OB/GYN Status:  No LMP for male patient.  General Assessment Data Assessment unable to be completed: Yes Reason for not completing assessment: Pt actively experiencing substance induced mood disorder. Mental status is alteren. Not yet appropriate for assessment.  Admission Status: Involuntary                    Mental Status Report Motor Activity: Unsteady                                       Disposition:     On Site Evaluation by:   Reviewed with Physician:    Jaydenn Boccio D Lailany Enoch 12/28/2017 6:18 AM

## 2017-12-28 NOTE — ED Notes (Signed)
Pt asleep after medications. Rise and fall of chest noted.

## 2017-12-28 NOTE — ED Notes (Signed)
Pt put blanket and pillow on the floor and is laying on floor. Reports he is comfortable.

## 2017-12-28 NOTE — Plan of Care (Addendum)
Patient found asleep in bed upon my arrival. Patient is isolative to his room except for eating snack and talking on the phone. Neither visible nor social. Reports, "I got a lot of stuff on my mind." Mood and affect are depressed and anxious. Continues to report passive SI with no plan or intent. Contracts for safety while on the unit. Compliant with EKG and urine sample. Denies pain. Patient has no scheduled HS medication. Given Trazodone for sleep. Will monitor for efficacy. Q 15 minute checks maintained. Will continue to monitor throughout the shift. Patient slept 8 hours. No apparent distress. Will endorse care to oncoming shift.  Problem: Education: Goal: Knowledge of the prescribed therapeutic regimen will improve Outcome: Progressing   Problem: Coping: Goal: Coping ability will improve Outcome: Not Progressing
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
## 2017-12-28 NOTE — Tx Team (Signed)
Initial Treatment Plan 12/28/2017 6:18 PM [name]. ZOX:[id number]    PATIENT STRESSORS: Financial difficulties Marital or family conflict Substance abuse   PATIENT STRENGTHS: Average or above average intelligence Communication skills Physical Health   PATIENT IDENTIFIED PROBLEMS: Depression  Substance abuse  Lost job                 DISCHARGE CRITERIA:  Adequate post-discharge living arrangements Medical problems require only outpatient monitoring Verbal commitment to aftercare and medication compliance  PRELIMINARY DISCHARGE PLAN: Attend aftercare/continuing care group Return to previous living arrangement  PATIENT/FAMILY INVOLVEMENT: This treatment plan has been presented to and reviewed with the patient, Russell Harrington., and/or family member,.  The patient and family have been given the opportunity to ask questions and make suggestions.  Leonarda Salon, RN 12/28/2017, 6:18 PM

## 2017-12-28 NOTE — ED Notes (Signed)
Pt given lunch tray.

## 2017-12-28 NOTE — ED Provider Notes (Addendum)
[facility] Emergency Department Provider Note  ____________________________________________   First MD Initiated Contact with Patient 12/28/17 [phone]     (approximate)  I have reviewed the triage vital signs and the nursing notes.   HISTORY  Chief Complaint Suicidal   Level 5 caveat:  history/ROS limited by active psychosis / mental illness / altered mental status    HPI Russell Harrington. is a 42 y.o. male who reports a history of polysubstance abuse and presents reporting that he wants to kill himself.  He says he has nothing to live for and takes whatever drugs he can get, mostly heroin and cocaine.  He says he is taken drugs all day long.  He is depressed because "they took away my daughter" and he says he cannot go outside the house for fear that someone is going to harm him.  Symptoms are severe and they have been gradually getting worse over time.  He says his whole body hurts but not any one thing in particular.  He denies chest pain, shortness of breath, nausea, vomiting, and abdominal pain.  History reviewed. No pertinent past medical history.  There are no active problems to display for this patient.   History reviewed. No pertinent surgical history.  Prior to Admission medications   Medication Sig Start Date End Date Taking? Authorizing Provider  dicyclomine (BENTYL) 20 MG tablet Take 1 tablet (20 mg total) by mouth 3 (three) times daily as needed for spasms. 05/04/15   Jennye Moccasin, MD  ibuprofen (ADVIL,MOTRIN) 800 MG tablet Take 1 tablet (800 mg total) by mouth every 8 (eight) hours as needed. 04/02/15   Joni Reining, PA-C  metroNIDAZOLE (FLAGYL) 500 MG tablet Take 1 tablet (500 mg total) by mouth 3 (three) times daily. 09/18/14   Emily Filbert, MD  oxyCODONE-acetaminophen (PERCOCET/ROXICET) 5-325 MG tablet Take 1 tablet by mouth every 4 (four) hours as needed for severe pain. 08/21/17   Merrily Brittle, MD  promethazine (PHENERGAN) 12.5  MG tablet Take 1 tablet (12.5 mg total) by mouth every 6 (six) hours as needed for nausea or vomiting. 05/04/15   Jennye Moccasin, MD  traMADol (ULTRAM) 50 MG tablet Take 1 tablet (50 mg total) by mouth every 6 (six) hours as needed for moderate pain. 04/02/15   Joni Reining, PA-C    Allergies Aspirin  No family history on file.  Social History Social History   Tobacco Use  . Smoking status: Current Every Day Smoker  . Smokeless tobacco: Never Used  Substance Use Topics  . Alcohol use: Yes  . Drug use: Yes    Review of Systems Level 5 caveat:  history/ROS limited by active psychosis / mental illness / altered mental status / intoxication.  See above for additional details.  ____________________________________________   PHYSICAL EXAM:  VITAL SIGNS: ED Triage Vitals  Enc Vitals Group     BP 12/28/17 0416 (!) 149/92     Pulse Rate 12/28/17 0416 75     Resp 12/28/17 0416 18     Temp 12/28/17 0416 98.4 F (36.9 C)     Temp Source 12/28/17 0416 Oral     SpO2 12/28/17 0416 98 %     Weight 12/28/17 0417 82 kg (180 lb 12.4 oz)     Height --      Head Circumference --      Peak Flow --      Pain Score 12/28/17 0417 0     Pain  Loc --      Pain Edu? --      Excl. in GC? --     Constitutional: Alert and oriented.  Somewhat agitated. Eyes: Conjunctivae are normal.  Head: Atraumatic. Nose: No congestion/rhinnorhea. Mouth/Throat: Mucous membranes are moist. Neck: No stridor.  No meningeal signs.   Cardiovascular: Normal rate, regular rhythm. Good peripheral circulation. Grossly normal heart sounds. Respiratory: Normal respiratory effort.  No retractions. Lungs CTAB. Gastrointestinal: Soft and nontender. No distention.  Musculoskeletal: No lower extremity tenderness nor edema. No gross deformities of extremities. Neurologic:  Normal speech and language. No gross focal neurologic deficits are appreciated.  Skin:  Skin is warm, dry and intact. No rash noted. Psychiatric:  Mood and affect are somewhat agitated consistent with reported history of drug abuse.  Reports depression and suicidal ideation.  ____________________________________________   LABS (all labs ordered are listed, but only abnormal results are displayed)  Labs Reviewed  COMPREHENSIVE METABOLIC PANEL - Abnormal; Notable for the following components:      Result Value   Glucose, Bld 114 (*)    Creatinine, Ser 1.27 (*)    Alkaline Phosphatase 35 (*)    Total Bilirubin 1.6 (*)    All other components within normal limits  ACETAMINOPHEN LEVEL - Abnormal; Notable for the following components:   Acetaminophen (Tylenol), Serum <10 (*)    All other components within normal limits  CBC - Abnormal; Notable for the following components:   WBC 23.9 (*)    All other components within normal limits  ETHANOL  SALICYLATE LEVEL  URINE DRUG SCREEN, QUALITATIVE ([facility] ONLY)   ____________________________________________  EKG  None - EKG not ordered by ED physician ____________________________________________  RADIOLOGY   ED MD interpretation: No indication for imaging  Official radiology report(s): No results found.  ____________________________________________   PROCEDURES  Critical Care performed: No   Procedure(s) performed:   Procedures   ____________________________________________   INITIAL IMPRESSION / ASSESSMENT AND PLAN / ED COURSE  As part of my medical decision making, I reviewed the following data within the electronic MEDICAL RECORD NUMBER Nursing notes reviewed and incorporated, Labs reviewed , Old chart reviewed and Notes from prior ED visits    Differential diagnosis includes, but is not limited to, substance-induced mood disorder, polysubstance abuse, major depressive disorder, adjustment disorder, schizophrenia.  Presentation is currently most consistent with substance-induced mood disorder.  He is increasingly agitated and is starting to bump into walls.  He has  no tachycardia and he states he has been doing a lot of heroin today so I am somewhat reluctant to provide a benzodiazepine.  He is willing to take medication by mouth so I am providing Zyprexa 10 mg by mouth.  I am also giving him a nicotine patch.  I placed him under involuntary commitment for evaluation by psychiatry.  He has a moderate leukocytosis of unclear significance but likely secondary to his drug use given that he has no other medical signs or symptoms.  Lab work is otherwise unremarkable.  There is no indication for additional medical work-up at this time.  Clinical Course as of Dec 29 638  Thu Dec 28, 2017  1610 The patient is now rolling around on the floor, banging his head with his hand, and pounding the floor in the walls.  He has already taken Zyprexa 10 mg by mouth.  I have authorized Versed 2 mg intramuscular and Benadryl 25 mg intramuscular as additional calming agents until the Zyprexa can become effective.  If necessary  he will get additional doses of antipsychotic such as Geodon.   [CF]    Clinical Course User Index [CF] Loleta Rose, MD    ____________________________________________  FINAL CLINICAL IMPRESSION(S) / ED DIAGNOSES  Final diagnoses:  Substance induced mood disorder (HCC)  Polysubstance abuse (HCC)  Suicidal ideation  Depression, unspecified depression type     MEDICATIONS GIVEN DURING THIS VISIT:  Medications  nicotine (NICODERM CQ - dosed in mg/24 hours) patch 21 mg (21 mg Transdermal Patch Applied 12/28/17 0520)  OLANZapine (ZYPREXA) tablet 10 mg (10 mg Oral Given 12/28/17 0521)  midazolam (VERSED) injection 2 mg (2 mg Intramuscular Given by Other 12/28/17 0535)  diphenhydrAMINE (BENADRYL) injection 25 mg (25 mg Intramuscular Given 12/28/17 0536)     ED Discharge Orders    None       Note:  This document was prepared using Dragon voice recognition software and may include unintentional dictation errors.    Loleta Rose,  MD 12/28/17 Jeralyn Bennett    Loleta Rose, MD 12/28/17 [phone]

## 2017-12-28 NOTE — ED Triage Notes (Signed)
Patient repeatedly stating "I been trying to kill myself". Patient asked the method of suicide attempt, patient did not clarify. patient unsteady on feet. Patient stated: "drugs". Patient falling asleep in triage.

## 2017-12-28 NOTE — ED Notes (Signed)
Patient reports heroin use tonight

## 2017-12-28 NOTE — ED Notes (Signed)
ED  Is the patient under IVC or is there intent for IVC: Yes.   Is the patient medically cleared: Yes.   Is there vacancy in the ED BHU: Yes.   Is the population mix appropriate for patient: Yes.   Is the patient awaiting placement in inpatient or outpatient setting: Yes.   Has the patient had a psychiatric consult: Yes.   Survey of unit performed for contraband, proper placement and condition of furniture, tampering with fixtures in bathroom, shower, and each patient room: Yes.  ; Findings:  APPEARANCE/BEHAVIOR Calm and cooperative NEURO ASSESSMENT Orientation: oriented x3  Denies pain Hallucinations: No.None noted (Hallucinations) denies  Speech: Normal Gait: normal RESPIRATORY ASSESSMENT Even  Unlabored respirations  CARDIOVASCULAR ASSESSMENT Pulses equal   regular rate  Skin warm and dry   GASTROINTESTINAL ASSESSMENT no GI complaint EXTREMITIES Full ROM  PLAN OF CARE Provide calm/safe environment. Vital signs assessed twice daily. ED BHU Assessment once each 12-hour shift. Collaborate with TTS daily or as condition indicates. Assure the ED provider has rounded once each shift. Provide and encourage hygiene. Provide redirection as needed. Assess for escalating behavior; address immediately and inform ED provider.  Assess family dynamic and appropriateness for visitation as needed: Yes.  ; If necessary, describe findings:  Educate the patient/family about BHU procedures/visitation: Yes.  ; If necessary, describe findings:   
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
## 2017-12-28 NOTE — ED Notes (Signed)
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
## 2017-12-29 DIAGNOSIS — F1320 Sedative, hypnotic or anxiolytic dependence, uncomplicated: Secondary | ICD-10-CM | POA: Diagnosis present

## 2017-12-29 DIAGNOSIS — F1220 Cannabis dependence, uncomplicated: Secondary | ICD-10-CM | POA: Diagnosis present

## 2017-12-29 DIAGNOSIS — F332 Major depressive disorder, recurrent severe without psychotic features: Principal | ICD-10-CM

## 2017-12-29 LAB — CBC
HEMATOCRIT: 44.1 % (ref 39.0–52.0)
HEMOGLOBIN: 14.7 g/dL (ref 13.0–17.0)
MCH: 29.1 pg (ref 26.0–34.0)
MCHC: 33.3 g/dL (ref 30.0–36.0)
MCV: 87.2 fL (ref 80.0–100.0)
NRBC: 0 % (ref 0.0–0.2)
Platelets: 266 10*3/uL (ref 150–400)
RBC: 5.06 MIL/uL (ref 4.22–5.81)
RDW: 13.6 % (ref 11.5–15.5)
WBC: 7.9 10*3/uL (ref 4.0–10.5)

## 2017-12-29 LAB — COMPREHENSIVE METABOLIC PANEL
ALK PHOS: 32 U/L — AB (ref 38–126)
ALT: 19 U/L (ref 0–44)
ANION GAP: 9 (ref 5–15)
AST: 32 U/L (ref 15–41)
Albumin: 3.7 g/dL (ref 3.5–5.0)
BILIRUBIN TOTAL: 1.2 mg/dL (ref 0.3–1.2)
BUN: 11 mg/dL (ref 6–20)
CALCIUM: 8.8 mg/dL — AB (ref 8.9–10.3)
CO2: 31 mmol/L (ref 22–32)
Chloride: 102 mmol/L (ref 98–111)
Creatinine, Ser: 1.45 mg/dL — ABNORMAL HIGH (ref 0.61–1.24)
GFR calc Af Amer: >60 mL/min (ref >60)
GFR calc non Af Amer: 59 mL/min — ABNORMAL LOW (ref >60)
Glucose, Bld: 104 mg/dL — ABNORMAL HIGH (ref 70–99)
POTASSIUM: 4.2 mmol/L (ref 3.5–5.1)
SODIUM: 142 mmol/L (ref 135–145)
TOTAL PROTEIN: 6.6 g/dL (ref 6.5–8.1)

## 2017-12-29 LAB — LIPID PANEL
CHOLESTEROL: 252 mg/dL — AB (ref 0–200)
HDL: 54 mg/dL (ref >40)
LDL Cholesterol: 173 mg/dL — ABNORMAL HIGH (ref 0–99)
Total CHOL/HDL Ratio: 4.7 RATIO
Triglycerides: 125 mg/dL (ref <150)
VLDL: 25 mg/dL (ref 0–40)

## 2017-12-29 LAB — TSH: TSH: 1.697 u[IU]/mL (ref 0.350–4.500)

## 2017-12-29 LAB — HEMOGLOBIN A1C
HEMOGLOBIN A1C: 5.6 % (ref 4.8–5.6)
MEAN PLASMA GLUCOSE: 114.02 mg/dL

## 2017-12-29 NOTE — Tx Team (Addendum)
Interdisciplinary Treatment and Diagnostic Plan Update  12/29/2017 Time of Session: 1030am Russell Harrington. MRN: [id number]  Principal Diagnosis: Severe recurrent major depression without psychotic features (HCC)  Secondary Diagnoses: Principal Problem:   Severe recurrent major depression without psychotic features (HCC) Active Problems:   Opioid use disorder, moderate, dependence (HCC)   Suicidal ideation   Cocaine use disorder, moderate, dependence (HCC)   Tobacco use disorder   Cannabis use disorder, moderate, dependence (HCC)   Sedative, hypnotic or anxiolytic use disorder, severe, dependence (HCC)   Current Medications:  Current Facility-Administered Medications  Medication Dose Route Frequency Provider Last Rate Last Dose  . acetaminophen (TYLENOL) tablet 650 mg  650 mg Oral Q6H PRN Clapacs, John T, MD      . alum & mag hydroxide-simeth (MAALOX/MYLANTA) 200-200-20 MG/5ML suspension 30 mL  30 mL Oral Q4H PRN Clapacs, John T, MD      . hydrOXYzine (ATARAX/VISTARIL) tablet 50 mg  50 mg Oral TID PRN Clapacs, John T, MD      . magnesium hydroxide (MILK OF MAGNESIA) suspension 30 mL  30 mL Oral Daily PRN Clapacs, John T, MD      . traZODone (DESYREL) tablet 100 mg  100 mg Oral QHS PRN Clapacs, Jackquline Denmark, MD   100 mg at 12/28/17 2129   PTA Medications: Medications Prior to Admission  Medication Sig Dispense Refill Last Dose  . dicyclomine (BENTYL) 20 MG tablet Take 1 tablet (20 mg total) by mouth 3 (three) times daily as needed for spasms. 30 tablet 0   . ibuprofen (ADVIL,MOTRIN) 800 MG tablet Take 1 tablet (800 mg total) by mouth every 8 (eight) hours as needed. 30 tablet 0   . metroNIDAZOLE (FLAGYL) 500 MG tablet Take 1 tablet (500 mg total) by mouth 3 (three) times daily. 21 tablet 0   . oxyCODONE-acetaminophen (PERCOCET/ROXICET) 5-325 MG tablet Take 1 tablet by mouth every 4 (four) hours as needed for severe pain. 7 tablet 0   . promethazine (PHENERGAN) 12.5 MG tablet Take 1 tablet  (12.5 mg total) by mouth every 6 (six) hours as needed for nausea or vomiting. 30 tablet 0   . traMADol (ULTRAM) 50 MG tablet Take 1 tablet (50 mg total) by mouth every 6 (six) hours as needed for moderate pain. 12 tablet 0     Patient Stressors: Financial difficulties Marital or family conflict Substance abuse  Patient Strengths: Average or above average intelligence Communication skills Physical Health  Treatment Modalities: Medication Management, Group therapy, Case management,  1 to 1 session with clinician, Psychoeducation, Recreational therapy.   Physician Treatment Plan for Primary Diagnosis: Severe recurrent major depression without psychotic features (HCC) Long Term Goal(s):     Short Term Goals:    Medication Management: Evaluate patient's response, side effects, and tolerance of medication regimen.  Therapeutic Interventions: 1 to 1 sessions, Unit Group sessions and Medication administration.  Evaluation of Outcomes: Progressing  Physician Treatment Plan for Secondary Diagnosis: Principal Problem:   Severe recurrent major depression without psychotic features (HCC) Active Problems:   Opioid use disorder, moderate, dependence (HCC)   Suicidal ideation   Cocaine use disorder, moderate, dependence (HCC)   Tobacco use disorder   Cannabis use disorder, moderate, dependence (HCC)   Sedative, hypnotic or anxiolytic use disorder, severe, dependence (HCC)  Long Term Goal(s):     Short Term Goals:       Medication Management: Evaluate patient's response, side effects, and tolerance of medication regimen.  Therapeutic Interventions: 1 to 1 sessions,  Unit Group sessions and Medication administration.  Evaluation of Outcomes: Progressing   RN Treatment Plan for Primary Diagnosis: Severe recurrent major depression without psychotic features (HCC) Long Term Goal(s): Knowledge of disease and therapeutic regimen to maintain health will improve  Short Term Goals: Ability  to verbalize feelings will improve, Ability to identify and develop effective coping behaviors will improve and Compliance with prescribed medications will improve  Medication Management: RN will administer medications as ordered by provider, will assess and evaluate patient's response and provide education to patient for prescribed medication. RN will report any adverse and/or side effects to prescribing provider.  Therapeutic Interventions: 1 on 1 counseling sessions, Psychoeducation, Medication administration, Evaluate responses to treatment, Monitor vital signs and CBGs as ordered, Perform/monitor CIWA, COWS, AIMS and Fall Risk screenings as ordered, Perform wound care treatments as ordered.  Evaluation of Outcomes: Progressing   LCSW Treatment Plan for Primary Diagnosis: Severe recurrent major depression without psychotic features (HCC) Long Term Goal(s): Safe transition to appropriate next level of care at discharge, Engage patient in therapeutic group addressing interpersonal concerns.  Short Term Goals: Engage patient in aftercare planning with referrals and resources, Increase emotional regulation, Facilitate acceptance of mental health diagnosis and concerns, Identify triggers associated with mental health/substance abuse issues and Increase skills for wellness and recovery  Therapeutic Interventions: Assess for all discharge needs, 1 to 1 time with Social worker, Explore available resources and support systems, Assess for adequacy in community support network, Educate family and significant other(s) on suicide prevention, Complete Psychosocial Assessment, Interpersonal group therapy.  Evaluation of Outcomes: Progressing   Progress in Treatment: Attending groups: Yes. Participating in groups: Yes. Taking medication as prescribed: Yes. Toleration medication: Yes. Family/Significant other contact made: No, will contact:  CSW will contact when pt gives consent Patient understands  diagnosis: Yes. Discussing patient identified problems/goals with staff: Yes. Medical problems stabilized or resolved: Yes. Denies suicidal/homicidal ideation: Yes. Issues/concerns per patient self-inventory: No. Other: NA  New problem(s) identified: No, Describe:  "Get my mind right, body right, detox and stay away from temptation"  New Short Term/Long Term Goal(s):"Get my mind right, body right, detox and stay away from temptation"  Patient Goals:  "Get my mind right, body right, detox and stay away from temptation"  Discharge Plan or Barriers: Pt will return home and follow up with outpatient treatment  Reason for Continuation of Hospitalization: Medication stabilization  Estimated Length of Stay: 5-7 days  Recreational Therapy: Patient Stressors: Family Patient Goal: Patient will identify 3 resources in their community to aid in recovery within 5 recreation therapy group sessions  Attendees: Patient:Delaine Sabastion, [place]. 12/29/2017 11:09 AM  Physician: Dr. Jennet Maduro 12/29/2017 11:09 AM  Nursing: Milas Hock, RN 12/29/2017 11:09 AM  RN Care Manager: 12/29/2017 11:09 AM  Social Worker: Elonda Husky Jarrett,LCSW 12/29/2017 11:09 AM  Recreational Therapist: Danella Deis. Dreama Saa, LRT 12/29/2017 11:09 AM  Other: Lowella Dandy, LCSW 12/29/2017 11:09 AM  Other: Huey Romans, LCSW 12/29/2017 11:09 AM  Other: 12/29/2017 11:09 AM    Scribe for Treatment Team: Suzan Slick, LCSW 12/29/2017 11:09 AM
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
## 2017-12-29 NOTE — Progress Notes (Signed)
Recreation Therapy Notes  INPATIENT RECREATION THERAPY ASSESSMENT  Patient Details Name: Russell Harrington. MRN: [id number] DOB: Jun 16, 1975 Today's Date: 12/29/2017       Information Obtained From: Patient  Able to Participate in Assessment/Interview: Yes  Patient Presentation: Responsive  Reason for Admission (Per Patient): Active Symptoms, Substance Abuse, Suicide Attempt  Patient Stressors: Family  Coping Skills:   Substance Abuse, Music  Leisure Interests (2+):  Social - Family(working and time with my daughter before DSS took her)  Frequency of Recreation/Participation: Monthly  Awareness of Community Resources:     Community Resources:     Current Use:    If no, Barriers?:    Expressed Interest in [employer] Information:    [place] of Residence:  Film/video editor  Patient Main Form of Transportation:    Patient Strengths:  Chief Executive Officer  Patient Identified Areas of Improvement:  Drug use  Patient Goal for Hospitalization:  Get mind right and body detoxed  Current SI (including self-harm):  No  Current HI:  No  Current AVH: No  Staff Intervention Plan: Group Attendance, Collaborate with Interdisciplinary Treatment Team  Consent to Intern Participation: N/A  Shaquira Moroz 12/29/2017, 2:57 PM

## 2017-12-29 NOTE — Progress Notes (Signed)
Recreation Therapy Notes  Date: 12/29/2017  Time: 9:30 am  Location: Craft Room  Behavioral response: Appropriate   Intervention Topic: Team work  Discussion/Intervention:  Group content on today was focused on teamwork. The group identified what teamwork is. Individuals described who is a part of their team. Patients expressed why they thought teamwork is important. The group stated reasons why they thought it was easier to work with a Comptroller team. Individuals discussed some positives and negatives of working with a team. Patients gave examples of past experiences they had while working with a team. The group participated in the intervention "What is That", where patients were given a chance to point out qualities, they look for in a team mate and were able to work in teams with each other. Clinical Observations/Feedback:  Patient came to group and was focused on what peers and staff had to say about team work. Individual was social with peers and staff while participating in the intervention.  Namish Krise LRT/CTRS         Del Overfelt 12/29/2017 11:23 AM

## 2017-12-29 NOTE — Plan of Care (Signed)
Patient was asleep in bed with eyes closed this morning upon my arrival to the unit. Patient's affect flat and sad but brightens upon approach. Patient currently denies having thoughts of self harm, states, "No not right now, as long as I'm in here I don't think about hurting myself. As soon as I get back to the real world I feel like I'm carrying the weight on my shoulders." Patient was encouraged to stay out of his room today and attend groups, patient agreed and said, "I will do that to keep my mind off of things." Compliant with meals, milieu remains safe with q 15 minute safety checks.

## 2017-12-29 NOTE — [facility] Group Notes (Signed)
Feelings Around Relapse 12/29/2017 1PM  Type of Therapy and Topic:  Group Therapy:  Feelings around Relapse and Recovery  Participation Level:  Did Not Attend   Description of Group:    Patients in this group will discuss emotions they experience before and after a relapse. They will process how experiencing these feelings, or avoidance of experiencing them, relates to having a relapse. Facilitator will guide patients to explore emotions they have related to recovery. Patients will be encouraged to process which emotions are more powerful. They will be guided to discuss the emotional reaction significant others in their lives may have to patients' relapse or recovery. Patients will be assisted in exploring ways to respond to the emotions of others without this contributing to a relapse.  Therapeutic Goals: 1. Patient will identify two or more emotions that lead to a relapse for them 2. Patient will identify two emotions that result when they relapse 3. Patient will identify two emotions related to recovery 4. Patient will demonstrate ability to communicate their needs through discussion and/or role plays   Summary of Patient Progress:     Therapeutic Modalities:   Cognitive Behavioral Therapy Solution-Focused Therapy Assertiveness Training Relapse Prevention Therapy   Suzan Slick, LCSW 12/29/2017 3:10 PM

## 2017-12-30 NOTE — Plan of Care (Signed)
Pt. Verbalizes understanding of education provided. Pt. Reports doing better this evening. Pt. Reports anxiety and depression, "2/10" this evening. Pt. Denies si/hi, contracts for safety.    Problem: Education: Goal: Knowledge of [place] [facility] Education information/materials will improve Outcome: Progressing Goal: Emotional status will improve Outcome: Progressing   Problem: Coping: Goal: Will verbalize feelings Outcome: Progressing

## 2017-12-30 NOTE — Progress Notes (Signed)
D: Pt. denies SI/HI/AVH, contracts for safety. Pt. Interactions this evening with this writer minimal and forwards little, but does engage when asked. Pt. Observed frequently watching tv this evening not socializing much, then goes to bed early. Pt. Affect flat, but appropriate. Pt. Reports doing better this evening. Pt. Reports anxiety and depression, "2/10".   A: Q x 15 minute observation checks were completed for safety. Patient was provided with education.  Patient was offered medications per orders. Patient  was encourage to attend groups, participate in unit activities and continue with plan of care. Pt. Chart and plans of care reviewed. Pt. Given support and encouragement.   R: Patient is complaint with unit procedures.             Precautionary checks every 15 minutes for safety maintained, room free of safety hazards, patient sustains no injury or falls during this shift. Will endorse care to next shift.

## 2017-12-30 NOTE — [facility] Suicide Risk Assessment (Signed)
[facility] Discharge Suicide Risk Assessment   Principal Problem: Severe recurrent major depression without psychotic features [facility]) Discharge Diagnoses:  Patient Active Problem List   Diagnosis Date Noted  . Severe recurrent major depression without psychotic features (HCC) [F33.2] 12/28/2017    Priority: High  . Cannabis use disorder, moderate, dependence (HCC) [F12.20] 12/29/2017  . Sedative, hypnotic or anxiolytic use disorder, severe, dependence (HCC) [F13.20] 12/29/2017  . Opioid use disorder, moderate, dependence (HCC) [F11.20] 12/28/2017  . Suicidal ideation [R45.851] 12/28/2017  . Cocaine use disorder, moderate, dependence (HCC) [F14.20] 12/28/2017  . Tobacco use disorder [F17.200] 12/28/2017    Total Time spent with patient: 20 minutes  Musculoskeletal: Strength & Muscle Tone: within normal limits Gait & Station: normal Patient leans: N/A  Psychiatric Specialty Exam: ROS  Blood pressure 117/76, pulse (!) 55, temperature 98.4 F (36.9 C), temperature source Oral, resp. rate 16, height 5\' 6"  (1.676 m), weight 77.1 kg, SpO2 100 %.Body mass index is 27.44 kg/m.  General Appearance: Casual  Eye Contact::  Good  Speech:  Clear and Coherent409  Volume:  Normal  Mood:  Euthymic  Affect:  Appropriate  Thought Process:  Goal Directed and Descriptions of Associations: Intact  Orientation:  Full (Time, Place, and Person)  Thought Content:  WDL  Suicidal Thoughts:  No  Homicidal Thoughts:  No  Memory:  Immediate;   Fair Recent;   Fair Remote;   Fair  Judgement:  Poor  Insight:  Lacking  Psychomotor Activity:  Normal  Concentration:  Fair  Recall:  [employer] of Knowledge:Fair  Language: Fair  Akathisia:  No  Handed:  Right  AIMS (if indicated):     Assets:  Communication Skills Desire for Improvement Physical Health Resilience Social Support  Sleep:  Number of Hours: 8  Cognition: WNL  ADL's:  Intact   Mental Status Per Nursing Assessment::   On Admission:   Suicidal ideation indicated by patient  Demographic Factors:  Male, Low socioeconomic status and Unemployed  Loss Factors: Decrease in vocational status, Loss of significant relationship, Legal issues and Financial problems/change in socioeconomic status  Historical Factors: Family history of mental illness or substance abuse and Impulsivity  Risk Reduction Factors:   Responsible for children under 89 years of age, Sense of responsibility to family, Living with another person, especially a relative and Positive social support  Continued Clinical Symptoms:  Depression:   Impulsivity Alcohol/Substance Abuse/Dependencies  Cognitive Features That Contribute To Risk:  None    Suicide Risk:  Minimal: No identifiable suicidal ideation.  Patients presenting with no risk factors but with morbid ruminations; may be classified as minimal risk based on the severity of the depressive symptoms    Plan Of Care/Follow-up recommendations:  Activity:  as tolerated Diet:  regular Other:  keep follow up appointments  Kristine Linea, MD 12/30/2017, 9:20 AM
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
## 2019-07-18 ENCOUNTER — Emergency Department
Admission: EM | Admit: 2019-07-18 | Discharge: 2019-07-18 | Disposition: A | Discharge status: Home / Self Care | Payer: Self-pay | Attending: Emergency Medicine | Admitting: Emergency Medicine

## 2019-07-18 ENCOUNTER — Emergency Department: Payer: Self-pay

## 2019-07-18 ENCOUNTER — Other Ambulatory Visit: Payer: Self-pay

## 2019-07-18 DIAGNOSIS — Y92410 Unspecified street and highway as the place of occurrence of the external cause: Secondary | ICD-10-CM | POA: Insufficient documentation

## 2019-07-18 DIAGNOSIS — S8392XA Sprain of unspecified site of left knee, initial encounter: Secondary | ICD-10-CM | POA: Insufficient documentation

## 2019-07-18 DIAGNOSIS — Y9389 Activity, other specified: Secondary | ICD-10-CM | POA: Insufficient documentation

## 2019-07-18 DIAGNOSIS — Y999 Unspecified external cause status: Secondary | ICD-10-CM | POA: Insufficient documentation

## 2019-07-18 DIAGNOSIS — T148XXA Other injury of unspecified body region, initial encounter: Secondary | ICD-10-CM

## 2019-07-18 DIAGNOSIS — F17210 Nicotine dependence, cigarettes, uncomplicated: Secondary | ICD-10-CM | POA: Insufficient documentation

## 2019-07-18 MED ORDER — TRAMADOL HCL 50 MG PO TABS: 50.0000 mg | ORAL_TABLET | Freq: Four times a day (QID) | ORAL | 0 refills | Status: DC | PRN

## 2019-07-18 MED ORDER — HYDROMORPHONE HCL 1 MG/ML IJ SOLN
1.0000 mg | Freq: Once | INTRAMUSCULAR | Status: AC
  Administered 2019-07-18: 1 mg via INTRAVENOUS
  Filled 2019-07-18: qty 1

## 2019-07-18 MED ORDER — ONDANSETRON HCL 4 MG/2ML IJ SOLN
4.0000 mg | Freq: Once | INTRAMUSCULAR | Status: AC
  Administered 2019-07-18: 4 mg via INTRAVENOUS
  Filled 2019-07-18: qty 2

## 2019-07-18 MED ORDER — MELOXICAM 15 MG PO TABS: 15.0000 mg | ORAL_TABLET | Freq: Every day | ORAL | 2 refills | Status: DC
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
## 2019-07-18 NOTE — Discharge Instructions (Addendum)
Follow-up with [facility] clinic orthopedics.  Please call for appointment.  Elevate and ice the knee.  Wear the knee immobilizer.  Take the medication as needed for pain.  Return emergency department if worsening

## 2019-09-30 ENCOUNTER — Other Ambulatory Visit: Payer: Self-pay

## 2019-09-30 ENCOUNTER — Ambulatory Visit: Payer: Self-pay

## 2019-11-29 ENCOUNTER — Other Ambulatory Visit: Payer: Self-pay

## 2019-11-29 ENCOUNTER — Emergency Department: Payer: Self-pay

## 2019-11-29 ENCOUNTER — Emergency Department
Admission: EM | Admit: 2019-11-29 | Discharge: 2019-11-29 | Disposition: A | Discharge status: Home / Self Care | Payer: Self-pay | Attending: Emergency Medicine | Admitting: Emergency Medicine

## 2019-11-29 ENCOUNTER — Encounter: Payer: Self-pay | Admitting: Intensive Care

## 2019-11-29 DIAGNOSIS — G8929 Other chronic pain: Secondary | ICD-10-CM | POA: Insufficient documentation

## 2019-11-29 DIAGNOSIS — M25562 Pain in left knee: Secondary | ICD-10-CM | POA: Insufficient documentation

## 2019-11-29 DIAGNOSIS — F17210 Nicotine dependence, cigarettes, uncomplicated: Secondary | ICD-10-CM | POA: Insufficient documentation

## 2019-11-29 MED ORDER — KETOROLAC TROMETHAMINE 30 MG/ML IJ SOLN
30.0000 mg | Freq: Once | INTRAMUSCULAR | Status: AC
  Administered 2019-11-29: 30 mg via INTRAMUSCULAR
  Filled 2019-11-29: qty 1

## 2019-11-29 MED ORDER — KETOROLAC TROMETHAMINE 10 MG PO TABS: 10.0000 mg | ORAL_TABLET | Freq: Four times a day (QID) | ORAL | 0 refills | Status: DC | PRN

## 2019-11-29 NOTE — Discharge Instructions (Addendum)
Your knee x-ray was negative for a fracture.  You may have injured the meniscus or a ligament.  You can buy a knee brace or sleeve from [facility] or [facility] to help support your knee.  Please call [facility] on Monday for a follow-up appointment.  Also call orthopedics for a follow-up appointment.
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
## 2019-12-19 ENCOUNTER — Ambulatory Visit: Payer: Self-pay | Admitting: Gerontology

## 2019-12-19 ENCOUNTER — Encounter: Payer: Self-pay | Admitting: Gerontology

## 2019-12-19 ENCOUNTER — Other Ambulatory Visit: Payer: Self-pay

## 2019-12-19 VITALS — BP 123/66 | HR 64 | Ht 66.0 in | Wt 177.3 lb

## 2019-12-19 DIAGNOSIS — M25562 Pain in left knee: Secondary | ICD-10-CM | POA: Insufficient documentation

## 2019-12-19 DIAGNOSIS — G8929 Other chronic pain: Secondary | ICD-10-CM

## 2019-12-19 DIAGNOSIS — Z7689 Persons encountering health services in other specified circumstances: Secondary | ICD-10-CM

## 2019-12-19 MED ORDER — ACETAMINOPHEN 500 MG PO TABS: 1000.0000 mg | ORAL_TABLET | Freq: Two times a day (BID) | ORAL | 0 refills | Status: DC
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
## 2019-12-24 ENCOUNTER — Telehealth: Payer: Self-pay

## 2019-12-24 ENCOUNTER — Ambulatory Visit: Payer: Self-pay | Admitting: Specialist

## 2019-12-24 ENCOUNTER — Other Ambulatory Visit: Payer: Self-pay
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
## 2019-12-24 NOTE — Telephone Encounter (Signed)
Called patient and left a message for him to call social work intern back for assistance with applying for unemployment and left the clinic's phone number.

## 2019-12-25 NOTE — Telephone Encounter (Signed)
Left message for patient, providing him with instructions to apply for unemployment online or over the phone and left the online address and phone number for unemployment benefit application.

## 2019-12-26 ENCOUNTER — Other Ambulatory Visit: Payer: Self-pay | Admitting: Gerontology

## 2019-12-26 DIAGNOSIS — G8929 Other chronic pain: Secondary | ICD-10-CM

## 2019-12-26 NOTE — Telephone Encounter (Signed)
Left message with patient's mother asking him to ask patient to call social work intern back and left clinic's phone number.  Call was to ensure patient has received instructions left on voicemail of how to apply for unemployment benefits.  Patient called back and was interested in a legal aid phone number.  Social work Tax inspector began to [employer] but due to weather, call was disconnected.    Social work Tax inspector called patient back but he was unavailable.

## 2019-12-26 NOTE — Telephone Encounter (Signed)
Patient called back and social work Tax inspector provided him with [facility] [employer] [facility] and [facility]'s phone numbers.

## 2020-01-02 ENCOUNTER — Other Ambulatory Visit: Payer: Self-pay

## 2020-01-02 DIAGNOSIS — Z7689 Persons encountering health services in other specified circumstances: Secondary | ICD-10-CM

## 2020-01-03 LAB — CBC WITH DIFFERENTIAL/PLATELET
Basophils Absolute: 0 10*3/uL (ref 0.0–0.2)
Basos: 0 %
EOS (ABSOLUTE): 0.2 10*3/uL (ref 0.0–0.4)
Eos: 1 %
Hematocrit: 45.6 % (ref 37.5–51.0)
Hemoglobin: 15.1 g/dL (ref 13.0–17.7)
Immature Grans (Abs): 0.1 10*3/uL (ref 0.0–0.1)
Immature Granulocytes: 1 %
Lymphocytes Absolute: 2.5 10*3/uL (ref 0.7–3.1)
Lymphs: 20 %
MCH: 28.3 pg (ref 26.6–33.0)
MCHC: 33.1 g/dL (ref 31.5–35.7)
MCV: 85 fL (ref 79–97)
Monocytes Absolute: 1 10*3/uL — ABNORMAL HIGH (ref 0.1–0.9)
Monocytes: 8 %
Neutrophils Absolute: 8.7 10*3/uL — ABNORMAL HIGH (ref 1.4–7.0)
Neutrophils: 70 %
Platelets: 228 10*3/uL (ref 150–450)
RBC: 5.34 x10E6/uL (ref 4.14–5.80)
RDW: 14.9 % (ref 11.6–15.4)
WBC: 12.5 10*3/uL — ABNORMAL HIGH (ref 3.4–10.8)

## 2020-01-03 LAB — COMPREHENSIVE METABOLIC PANEL
ALT: 12 IU/L (ref 0–44)
AST: 14 IU/L (ref 0–40)
Albumin/Globulin Ratio: 1.8 (ref 1.2–2.2)
Albumin: 3.8 g/dL — ABNORMAL LOW (ref 4.0–5.0)
Alkaline Phosphatase: 49 IU/L (ref 44–121)
BUN/Creatinine Ratio: 9 (ref 9–20)
BUN: 10 mg/dL (ref 6–24)
Bilirubin Total: 0.6 mg/dL (ref 0.0–1.2)
CO2: 25 mmol/L (ref 20–29)
Calcium: 9 mg/dL (ref 8.7–10.2)
Chloride: 106 mmol/L (ref 96–106)
Creatinine, Ser: 1.17 mg/dL (ref 0.76–1.27)
GFR calc Af Amer: 88 mL/min/{1.73_m2} (ref >59)
GFR calc non Af Amer: 76 mL/min/{1.73_m2} (ref >59)
Globulin, Total: 2.1 g/dL (ref 1.5–4.5)
Glucose: 94 mg/dL (ref 65–99)
Potassium: 4.6 mmol/L (ref 3.5–5.2)
Sodium: 143 mmol/L (ref 134–144)
Total Protein: 5.9 g/dL — ABNORMAL LOW (ref 6.0–8.5)

## 2020-01-03 LAB — URINALYSIS
Bilirubin, UA: NEGATIVE
Glucose, UA: NEGATIVE
Ketones, UA: NEGATIVE
Nitrite, UA: NEGATIVE
Protein,UA: NEGATIVE
RBC, UA: NEGATIVE
Specific Gravity, UA: 1.016 (ref 1.005–1.030)
Urobilinogen, Ur: 1 mg/dL (ref 0.2–1.0)
pH, UA: 6.5 (ref 5.0–7.5)

## 2020-01-03 LAB — TSH: TSH: 2.36 u[IU]/mL (ref 0.450–4.500)

## 2020-01-03 LAB — LIPID PANEL
Chol/HDL Ratio: 3.5 ratio (ref 0.0–5.0)
Cholesterol, Total: 197 mg/dL (ref 100–199)
HDL: 56 mg/dL (ref >39)
LDL Chol Calc (NIH): 91 mg/dL (ref 0–99)
Triglycerides: 302 mg/dL — ABNORMAL HIGH (ref 0–149)
VLDL Cholesterol Cal: 50 mg/dL — ABNORMAL HIGH (ref 5–40)

## 2020-01-03 LAB — HEMOGLOBIN A1C
Est. average glucose Bld gHb Est-mCnc: 120 mg/dL
Hgb A1c MFr Bld: 5.8 % — ABNORMAL HIGH (ref 4.8–5.6)

## 2020-01-09 ENCOUNTER — Ambulatory Visit: Payer: Self-pay | Admitting: Gerontology

## 2020-01-09 ENCOUNTER — Encounter: Payer: Self-pay | Admitting: Gerontology

## 2020-01-09 ENCOUNTER — Other Ambulatory Visit: Payer: Self-pay

## 2020-01-09 VITALS — BP 103/68 | HR 80 | Wt 182.9 lb

## 2020-01-09 DIAGNOSIS — F17200 Nicotine dependence, unspecified, uncomplicated: Secondary | ICD-10-CM

## 2020-01-09 DIAGNOSIS — R7303 Prediabetes: Secondary | ICD-10-CM

## 2020-01-09 DIAGNOSIS — G8929 Other chronic pain: Secondary | ICD-10-CM

## 2020-01-09 NOTE — Patient Instructions (Signed)
Chronic Knee Pain, Adult Knee pain that lasts longer than 3 months is called chronic knee pain. You may have pain in one or both knees. Symptoms of chronic knee pain may also include swelling and stiffness. The most common cause is age-related wear and tear (osteoarthritis) of your knee joint. Many conditions can cause chronic knee pain. Treatment depends on the cause. The main treatments are physical therapy and weight loss. It may also be treated with medicines, injections, a knee sleeve or brace, and by using crutches. Rest, ice, compression (pressure), and elevation (RICE) therapy may also be recommended. Follow these instructions at home: If you have a knee sleeve or brace:   Wear it as told by your doctor. Remove it only as told by your doctor.  Loosen it if your toes: ? Tingle. ? Become numb. ? Turn cold and blue.  Keep it clean.  If the sleeve or brace is not waterproof: ? Do not let it get wet. ? Remove it if told by your doctor, or cover it with a watertight covering when you take a bath or shower. Managing pain, stiffness, and swelling      If told, put heat on your knee. Do this as often as told by your doctor. Use the heat source that your doctor recommends, such as a moist heat pack or a heating pad. ? If you have a removable sleeve or brace, remove it as told by your doctor. ? Place a towel between your skin and the heat source. ? Leave the heat on for 20-30 minutes. ? Remove the heat if your skin turns bright red. This is very important if you are unable to feel pain, heat, or cold. You may have a greater risk of getting burned.  If told, put ice on your knee. ? If you have a removable sleeve or brace, remove it as told by your doctor. ? Put ice in a plastic bag. ? Place a towel between your skin and the bag. ? Leave the ice on for 20 minutes, 2-3 times a day.  Move your toes often.  Raise (elevate) the injured area above the level of your heart while you are  sitting or lying down. Activity  Avoid activities where both feet leave the ground at the same time (high-impact activities). Examples are running, jumping rope, and doing jumping jacks.  Return to your normal activities as told by your doctor. Ask your doctor what activities are safe for you.  Follow the exercise plan that your doctor makes for you. Your doctor may suggest that you: ? Avoid activities that make knee pain worse. You may need to change the exercises that you do, the sports that you participate in, or your job duties. ? Wear shoes with cushioned soles. ? Avoid high-impact activities or sports that require running and sudden changes in direction. ? Do exercises or physical therapy as told by your doctor. Physical therapy is planned to match your needs and abilities. ? Do exercises that increase your balance and strength, such as tai chi and yoga.  Do not use your injured knee to support your body weight until your doctor says that you can. Use crutches, a cane, or a walker, as told by your doctor. General instructions  Take over-the-counter and prescription medicines only as told by your doctor.  If you are overweight, work with your doctor and a food expert (dietitian) to set goals to lose weight. Being overweight can make your knee hurt more.  Do   not use any products that contain nicotine or tobacco, such as cigarettes, e-cigarettes, and chewing tobacco. If you need help quitting, ask your doctor.  Keep all follow-up visits as told by your doctor. This is important. Contact a doctor if:  You have knee pain that is not getting better or gets worse.  You are not able to do your exercises due to knee pain. Get help right away if:  Your knee swells and the swelling becomes worse.  You cannot move your knee.  You have very bad knee pain. Summary  Knee pain that lasts more than 3 months is considered chronic knee pain.  The main treatments for chronic knee pain are  physical therapy and weight loss. You may also need to take medicines, wear a knee sleeve or brace, use crutches, and put ice or heat on your knee.  Lose weight if you are overweight. Work with your doctor and a food expert (dietitian) to help you set goals to lose weight. Being overweight can make your knee hurt more.  Work with a physical therapist to make a safe exercise program, as told by your doctor. This information is not intended to replace advice given to you by your health care provider. Make sure you discuss any questions you have with your health care provider. Document Revised: 04/26/2018 Document Reviewed: 04/26/2018 Elsevier Patient Education  2020 [employer].  Prediabetes Eating Plan Prediabetes is a condition that causes blood sugar (glucose) levels to be higher than normal. This increases the risk for developing diabetes. In order to prevent diabetes from developing, your health care provider may recommend a diet and other lifestyle changes to help you:  Control your blood glucose levels.  Improve your cholesterol levels.  Manage your blood pressure. Your health care provider may recommend working with a diet and nutrition specialist (dietitian) to make a meal plan that is best for you. What are tips for following this plan? Lifestyle  Set weight loss goals with the help of your health care team. It is recommended that most people with prediabetes lose 7% of their current body weight.  Exercise for at least 30 minutes at least 5 days a week.  Attend a support group or seek ongoing support from a mental health counselor.  Take over-the-counter and prescription medicines only as told by your health care provider. Reading food labels  Read food labels to check the amount of fat, salt (sodium), and sugar in prepackaged foods. Avoid foods that have: ? Saturated fats. ? Trans fats. ? Added sugars.  Avoid foods that have more than 300 milligrams (mg) of sodium per  serving. Limit your daily sodium intake to less than 2,300 mg each day. Shopping  Avoid buying pre-made and processed foods. Cooking  Cook with olive oil. Do not use butter, lard, or ghee.  Bake, broil, grill, or boil foods. Avoid frying. Meal planning   Work with your dietitian to develop an eating plan that is right for you. This may include: ? Tracking how many calories you take in. Use a food diary, notebook, or mobile application to track what you eat at each meal. ? Using the glycemic index (GI) to plan your meals. The index tells you how quickly a food will raise your blood glucose. Choose low-GI foods. These foods take a longer time to raise blood glucose.  Consider following a Mediterranean diet. This diet includes: ? Several servings each day of fresh fruits and vegetables. ? Eating fish at least twice a  week. ? Several servings each day of whole grains, beans, nuts, and seeds. ? Using olive oil instead of other fats. ? Moderate alcohol consumption. ? Eating small amounts of red meat and whole-fat dairy.  If you have high blood pressure, you may need to limit your sodium intake or follow a diet such as the DASH eating plan. DASH is an eating plan that aims to lower high blood pressure. What foods are recommended? The items listed below may not be a complete list. Talk with your dietitian about what dietary choices are best for you. Grains Whole grains, such as whole-wheat or whole-grain breads, crackers, cereals, and pasta. Unsweetened oatmeal. Bulgur. Barley. Quinoa. Brown rice. Corn or whole-wheat flour tortillas or taco shells. Vegetables Lettuce. Spinach. Peas. Beets. Cauliflower. Cabbage. Broccoli. Carrots. Tomatoes. Squash. Eggplant. Herbs. Peppers. Onions. Cucumbers. Brussels sprouts. Fruits Berries. Bananas. Apples. Oranges. Grapes. Papaya. Mango. Pomegranate. Kiwi. Grapefruit. Cherries. Meats and other protein foods Seafood. Poultry without skin. Lean cuts of pork  and beef. Tofu. Eggs. Nuts. Beans. Dairy Low-fat or fat-free dairy products, such as yogurt, cottage cheese, and cheese. Beverages Water. Tea. Coffee. Sugar-free or diet soda. Seltzer water. Lowfat or no-fat milk. Milk alternatives, such as soy or almond milk. Fats and oils Olive oil. Canola oil. Sunflower oil. Grapeseed oil. Avocado. Walnuts. Sweets and desserts Sugar-free or low-fat pudding. Sugar-free or low-fat ice cream and other frozen treats. Seasoning and other foods Herbs. Sodium-free spices. Mustard. Relish. Low-fat, low-sugar ketchup. Low-fat, low-sugar barbecue sauce. Low-fat or fat-free mayonnaise. What foods are not recommended? The items listed below may not be a complete list. Talk with your dietitian about what dietary choices are best for you. Grains Refined white flour and flour products, such as bread, pasta, snack foods, and cereals. Vegetables Canned vegetables. Frozen vegetables with butter or cream sauce. Fruits Fruits canned with syrup. Meats and other protein foods Fatty cuts of meat. Poultry with skin. Breaded or fried meat. Processed meats. Dairy Full-fat yogurt, cheese, or milk. Beverages Sweetened drinks, such as sweet iced tea and soda. Fats and oils Butter. Lard. Ghee. Sweets and desserts Baked goods, such as cake, cupcakes, pastries, cookies, and cheesecake. Seasoning and other foods Spice mixes with added salt. Ketchup. Barbecue sauce. Mayonnaise. Summary  To prevent diabetes from developing, you may need to make diet and other lifestyle changes to help control blood sugar, improve cholesterol levels, and manage your blood pressure.  Set weight loss goals with the help of your health care team. It is recommended that most people with prediabetes lose 7 percent of their current body weight.  Consider following a Mediterranean diet that includes plenty of fresh fruits and vegetables, whole grains, beans, nuts, seeds, fish, lean meat, low-fat dairy,  and healthy oils. This information is not intended to replace advice given to you by your health care provider. Make sure you discuss any questions you have with your health care provider. Document Revised: 06/08/2018 Document Reviewed: 04/20/2016 Elsevier Patient Education  2020 [employer].

## 2020-01-09 NOTE — Progress Notes (Signed)
Established Patient Office Visit  Subjective:  Patient ID: Russell Karen., male    DOB: [birth year]  Age: 44 y.o. MRN: [id number]  CC:  Chief Complaint  Patient presents with  . Knee Injury    accident at work, knee feels locked and experiencing knee pain     HPI Russell Harrington. is a 44 y.o male who presented for follow up of left knee pain and lab review. He verbalized not picking up his medication from medication management and stated he preferred [facility] pharmacy. His pharmacy preference was switched accordingly. He continues to report limited range of motion to his left knee and states that his knee contiues to lock up. He was seen by Dr. Justice Rocher on 12/24/2019 and an MRI of the knee was ordered. His HgbA1C done 01/02/2020 was 5.8%. His triglyceride and VLDL cholesterol were noted to be 302 mg/dL and 50 mg/dL respectively. He verbalized smoking 1 pack of cigarette weekly and indicated an interest in quitting. He denies cough, shortness of breath or dyspnea with exertion. He states that his mood is good.  Overall, he states that he is doing well and offer no further complaint.  History reviewed. No pertinent past medical history. History reviewed. No pertinent surgical history.  History reviewed. No pertinent family history.  Social History   Socioeconomic History  . Marital status: Single    Spouse name: Not on file  . Number of children: Not on file  . Years of education: Not on file  . Highest education level: Not on file  Occupational History  . Not on file  Tobacco Use  . Smoking status: Current Every Day Smoker    Packs/day: 0.50    Types: Cigarettes  . Smokeless tobacco: Never Used  Vaping Use  . Vaping Use: Never used  Substance and Sexual Activity  . Alcohol use: Yes    Alcohol/week: 6.0 standard drinks    Types: 6 Cans of beer per week  . Drug use: Yes    Types: Marijuana  . Sexual activity: Not on file  Other Topics Concern  . Not on file  Social History  Narrative  . Not on file   Social Determinants of Health   Financial Resource Strain:   . Difficulty of Paying Living Expenses: Not on file  Food Insecurity:   . Worried About Programme researcher, broadcasting/film/video in the Last Year: Not on file  . Ran Out of Food in the Last Year: Not on file  Transportation Needs:   . Lack of Transportation (Medical): Not on file  . Lack of Transportation (Non-Medical): Not on file  Physical Activity:   . Days of Exercise per Week: Not on file  . Minutes of Exercise per Session: Not on file  Stress:   . Feeling of Stress : Not on file  Social Connections:   . Frequency of Communication with Friends and Family: Not on file  . Frequency of Social Gatherings with Friends and Family: Not on file  . Attends Religious Services: Not on file  . Active Member of Clubs or Organizations: Not on file  . Attends Banker Meetings: Not on file  . Marital Status: Not on file  Intimate Partner Violence:   . Fear of Current or Ex-Partner: Not on file  . Emotionally Abused: Not on file  . Physically Abused: Not on file  . Sexually Abused: Not on file    Outpatient Medications Prior to Visit  Medication Sig Dispense Refill  .  acetaminophen (TYLENOL) 500 MG tablet Take 2 tablets (1,000 mg total) by mouth in the morning and at bedtime. (Patient not taking: Reported on 01/09/2020) 30 tablet 0   No facility-administered medications prior to visit.    Allergies  Allergen Reactions  . Aspirin Nausea And Vomiting    ROS Review of Systems  Constitutional: Negative.   HENT: Negative.   Eyes: Negative.   Respiratory: Negative.   Cardiovascular: Negative.   Gastrointestinal: Negative.   Endocrine: Negative.   Genitourinary: Negative.   Musculoskeletal: Positive for myalgias (.Left knee pain. Currently being followed by Dr. Justice Rocher ).  Skin: Negative.   Psychiatric/Behavioral: Negative.       Objective:    Physical Exam Constitutional:      Appearance:  Normal appearance.  HENT:     Head: Normocephalic and atraumatic.  Eyes:     Pupils: Pupils are equal, round, and reactive to light.  Cardiovascular:     Rate and Rhythm: Normal rate and regular rhythm.     Pulses: Normal pulses.     Heart sounds: Normal heart sounds.  Pulmonary:     Effort: Pulmonary effort is normal.     Breath sounds: Normal breath sounds.  Musculoskeletal:        General: Tenderness (.Left knee pain with limited range of motion,) present.  Neurological:     General: No focal deficit present.     Mental Status: He is alert and oriented to person, place, and time.  Psychiatric:        Mood and Affect: Mood normal.        Behavior: Behavior normal.        Thought Content: Thought content normal.        Judgment: Judgment normal.     BP 103/68 (BP Location: Right Arm, Patient Position: Sitting, Cuff Size: Large)   Pulse 80   Wt 182 lb 14.4 oz (83 kg)   SpO2 95%   BMI 29.52 kg/m  Wt Readings from Last 3 Encounters:  01/09/20 182 lb 14.4 oz (83 kg)  12/19/19 177 lb 4.8 oz (80.4 kg)  11/29/19 180 lb (81.6 kg)     Health Maintenance Due  Topic Date Due  . Hepatitis C Screening  Never done  . COVID-19 Vaccine (1) Never done  . HIV Screening  Never done  . TETANUS/TDAP  Never done  . INFLUENZA VACCINE  Never done    There are no preventive care reminders to display for this patient.  Lab Results  Component Value Date   TSH 2.360 01/02/2020   Lab Results  Component Value Date   WBC 12.5 (H) 01/02/2020   HGB 15.1 01/02/2020   HCT 45.6 01/02/2020   MCV 85 01/02/2020   PLT 228 01/02/2020   Lab Results  Component Value Date   NA 143 01/02/2020   K 4.6 01/02/2020   CO2 25 01/02/2020   GLUCOSE 94 01/02/2020   BUN 10 01/02/2020   CREATININE 1.17 01/02/2020   BILITOT 0.6 01/02/2020   ALKPHOS 49 01/02/2020   AST 14 01/02/2020   ALT 12 01/02/2020   PROT 5.9 (L) 01/02/2020   ALBUMIN 3.8 (L) 01/02/2020   CALCIUM 9.0 01/02/2020   ANIONGAP 9  12/29/2017   Lab Results  Component Value Date   CHOL 197 01/02/2020   Lab Results  Component Value Date   HDL 56 01/02/2020   Lab Results  Component Value Date   LDLCALC 91 01/02/2020   Lab Results  Component Value  Date   TRIG 302 (H) 01/02/2020   Lab Results  Component Value Date   CHOLHDL 3.5 01/02/2020   Lab Results  Component Value Date   HGBA1C 5.8 (H) 01/02/2020      Assessment & Plan:   1. Chronic pain of left knee He continues to report discomfort and limited rage of motion to his left knee.  His Pharmacy information was switched accordingly and he was encouraged to pick up his medication from the Pharmacy. He was advised to apply cold and warm compress to his left knee, and elevated knee.  He ws provided with the necessary information and advised to schedule his MRI appointment. Will follow up with Dr. Justice Rocher after his MRI appointment   2. Tobacco use disorder He was strongly encouraged to quit smoking and was provided with smoking cessation.   3. Prediabetes His HgbA1C was 5.8%. His goal should be <5.7% He was encouraged to continue low carb/ concentrated sweet diet and exercise as tolerated.      Follow-up: On 03/21/2020 or sooner if symptoms worsen or fail to improve.    Onnie Graham, RN

## 2020-01-22 ENCOUNTER — Other Ambulatory Visit: Payer: Self-pay

## 2020-01-22 ENCOUNTER — Ambulatory Visit
Admission: RE | Admit: 2020-01-22 | Discharge: 2020-01-22 | Disposition: A | Discharge status: Home / Self Care | Payer: Self-pay | Source: Ambulatory Visit | Attending: Gerontology | Admitting: Gerontology

## 2020-01-22 DIAGNOSIS — G8929 Other chronic pain: Secondary | ICD-10-CM | POA: Insufficient documentation

## 2020-01-22 DIAGNOSIS — M25562 Pain in left knee: Secondary | ICD-10-CM | POA: Insufficient documentation

## 2020-01-28 ENCOUNTER — Ambulatory Visit: Payer: Self-pay | Admitting: Specialist

## 2020-01-30 ENCOUNTER — Telehealth: Payer: Self-pay | Admitting: Gerontology

## 2020-02-11 ENCOUNTER — Other Ambulatory Visit: Payer: Self-pay

## 2020-02-11 ENCOUNTER — Ambulatory Visit: Payer: Self-pay | Admitting: Specialist

## 2020-02-11 DIAGNOSIS — S83209A Unspecified tear of unspecified meniscus, current injury, unspecified knee, initial encounter: Secondary | ICD-10-CM | POA: Insufficient documentation

## 2020-02-11 DIAGNOSIS — S83207A Unspecified tear of unspecified meniscus, current injury, left knee, initial encounter: Secondary | ICD-10-CM

## 2020-02-11 NOTE — Progress Notes (Unsigned)
HPI: I am sure where my notes are from my last 10/26 visit but I did send him for MRI looking for a bucket handle tear left medial meniscus. The MRI shows a radial tear of the lateral meniscus.  Today he is still walking with a flexed knee. I told him he should be on crutches and he said he has some. Why he is not using them is not explained.   Physical Assessment: Today I am unable to passively extend the knee beyond -40 degrees. He is complaining of generalize pain.  Based on MRI findings and his continued problems I will refer him to someone who could consider arthropathy of the knee.  Plan: Refer to [facility] health for further evaluation of knee Dx torn meniscus left knee

## 2020-03-04 ENCOUNTER — Telehealth: Payer: Self-pay | Admitting: Gerontology

## 2020-03-04 NOTE — Telephone Encounter (Signed)
Called to reschedule 1/20 appt. LVM

## 2020-03-10 ENCOUNTER — Encounter: Payer: Self-pay | Admitting: Orthopaedic Surgery

## 2020-03-10 ENCOUNTER — Ambulatory Visit (INDEPENDENT_AMBULATORY_CARE_PROVIDER_SITE_OTHER): Payer: Self-pay | Admitting: Orthopaedic Surgery

## 2020-03-10 DIAGNOSIS — S83282A Other tear of lateral meniscus, current injury, left knee, initial encounter: Secondary | ICD-10-CM | POA: Insufficient documentation
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
## 2020-03-13 ENCOUNTER — Other Ambulatory Visit: Payer: Self-pay

## 2020-03-13 ENCOUNTER — Encounter (HOSPITAL_BASED_OUTPATIENT_CLINIC_OR_DEPARTMENT_OTHER): Payer: Self-pay | Admitting: Orthopaedic Surgery

## 2020-03-18 ENCOUNTER — Other Ambulatory Visit (HOSPITAL_COMMUNITY): Payer: Self-pay | Attending: Orthopaedic Surgery

## 2020-03-19 ENCOUNTER — Ambulatory Visit: Payer: Self-pay | Admitting: Gerontology

## 2020-03-19 ENCOUNTER — Inpatient Hospital Stay (HOSPITAL_COMMUNITY): Admission: RE | Admit: 2020-03-19 | Payer: Self-pay | Source: Ambulatory Visit

## 2020-03-19 NOTE — Progress Notes (Signed)
Spoke with patient re: Covid test. Patient completed PCR Covid test at [facility] on 03/17/20. Sent copy of negative result. Reviewed by IP and deemed acceptable for preprocedural testing.Patient confirmed quarantine since testing. States girlfriend Cala Bradford will bring him and take him home on [place] and care for him at home. Confirmed arrival time and NPO instructions.

## 2020-03-19 NOTE — Progress Notes (Signed)
Left message for patient to go for covid testing, times and address given. Left message for Sherri at Dr. Warren Danes office.

## 2020-03-20 ENCOUNTER — Ambulatory Visit (HOSPITAL_BASED_OUTPATIENT_CLINIC_OR_DEPARTMENT_OTHER): Payer: Self-pay | Admitting: Certified Registered Nurse Anesthetist

## 2020-03-20 ENCOUNTER — Ambulatory Visit (HOSPITAL_BASED_OUTPATIENT_CLINIC_OR_DEPARTMENT_OTHER)
Admission: RE | Admit: 2020-03-20 | Discharge: 2020-03-20 | Disposition: A | Discharge status: Home / Self Care | Payer: Self-pay | Attending: Orthopaedic Surgery | Admitting: Orthopaedic Surgery

## 2020-03-20 ENCOUNTER — Encounter (HOSPITAL_BASED_OUTPATIENT_CLINIC_OR_DEPARTMENT_OTHER): Payer: Self-pay | Admitting: Orthopaedic Surgery

## 2020-03-20 ENCOUNTER — Other Ambulatory Visit: Payer: Self-pay

## 2020-03-20 ENCOUNTER — Encounter (HOSPITAL_BASED_OUTPATIENT_CLINIC_OR_DEPARTMENT_OTHER)
Admission: RE | Disposition: A | Discharge status: Home / Self Care | Payer: Self-pay | Source: Home / Self Care | Attending: Orthopaedic Surgery

## 2020-03-20 DIAGNOSIS — X58XXXA Exposure to other specified factors, initial encounter: Secondary | ICD-10-CM | POA: Insufficient documentation

## 2020-03-20 DIAGNOSIS — Z8616 Personal history of COVID-19: Secondary | ICD-10-CM | POA: Insufficient documentation

## 2020-03-20 DIAGNOSIS — S83512A Sprain of anterior cruciate ligament of left knee, initial encounter: Secondary | ICD-10-CM | POA: Insufficient documentation

## 2020-03-20 DIAGNOSIS — M659 Synovitis and tenosynovitis, unspecified: Secondary | ICD-10-CM | POA: Insufficient documentation

## 2020-03-20 DIAGNOSIS — Z886 Allergy status to analgesic agent status: Secondary | ICD-10-CM | POA: Insufficient documentation

## 2020-03-20 DIAGNOSIS — M94262 Chondromalacia, left knee: Secondary | ICD-10-CM | POA: Insufficient documentation

## 2020-03-20 HISTORY — DX: Personal history of COVID-19: Z86.16

## 2020-03-20 HISTORY — PX: KNEE ARTHROSCOPY WITH MEDIAL MENISECTOMY: SHX5651

## 2020-03-20 HISTORY — DX: Unspecified asthma, uncomplicated: J45.909

## 2020-03-20 LAB — RAPID URINE DRUG SCREEN, HOSP PERFORMED
Amphetamines: NOT DETECTED
Barbiturates: NOT DETECTED
Benzodiazepines: NOT DETECTED
Cocaine: NOT DETECTED
Opiates: NOT DETECTED
Tetrahydrocannabinol: POSITIVE — AB

## 2020-03-20 SURGERY — ARTHROSCOPY, KNEE, WITH MEDIAL MENISCECTOMY
Anesthesia: General | Site: Knee | Laterality: Left

## 2020-03-20 MED ORDER — MIDAZOLAM HCL 2 MG/2ML IJ SOLN
INTRAMUSCULAR | Status: AC
  Filled 2020-03-20: qty 2

## 2020-03-20 MED ORDER — PROPOFOL 10 MG/ML IV BOLUS
INTRAVENOUS | Status: DC | PRN
  Administered 2020-03-20: 30 mg via INTRAVENOUS
  Administered 2020-03-20: 170 mg via INTRAVENOUS

## 2020-03-20 MED ORDER — CEFAZOLIN SODIUM-DEXTROSE 2-4 GM/100ML-% IV SOLN
INTRAVENOUS | Status: AC
  Filled 2020-03-20: qty 100

## 2020-03-20 MED ORDER — LIDOCAINE HCL (CARDIAC) PF 100 MG/5ML IV SOSY
PREFILLED_SYRINGE | INTRAVENOUS | Status: DC | PRN
  Administered 2020-03-20: 100 mg via INTRAVENOUS

## 2020-03-20 MED ORDER — OXYCODONE HCL 5 MG/5ML PO SOLN
5.0000 mg | Freq: Once | ORAL | Status: AC | PRN
Start: 2020-03-20 — End: 2020-03-20

## 2020-03-20 MED ORDER — MIDAZOLAM HCL 5 MG/5ML IJ SOLN
INTRAMUSCULAR | Status: DC | PRN
  Administered 2020-03-20: 2 mg via INTRAVENOUS

## 2020-03-20 MED ORDER — DEXAMETHASONE SODIUM PHOSPHATE 10 MG/ML IJ SOLN
INTRAMUSCULAR | Status: DC | PRN
  Administered 2020-03-20: 10 mg via INTRAVENOUS

## 2020-03-20 MED ORDER — GLYCOPYRROLATE 0.2 MG/ML IJ SOLN
INTRAMUSCULAR | Status: DC | PRN
  Administered 2020-03-20: .2 mg via INTRAVENOUS

## 2020-03-20 MED ORDER — FENTANYL CITRATE (PF) 100 MCG/2ML IJ SOLN
25.0000 ug | INTRAMUSCULAR | Status: DC | PRN
Start: 2020-03-20 — End: 2020-03-20

## 2020-03-20 MED ORDER — DEXMEDETOMIDINE HCL 200 MCG/2ML IV SOLN
INTRAVENOUS | Status: DC | PRN
  Administered 2020-03-20: 12 ug via INTRAVENOUS

## 2020-03-20 MED ORDER — ACETAMINOPHEN 500 MG PO TABS
ORAL_TABLET | ORAL | Status: AC
  Filled 2020-03-20: qty 2

## 2020-03-20 MED ORDER — LACTATED RINGERS IV SOLN: INTRAVENOUS | Status: DC

## 2020-03-20 MED ORDER — FENTANYL CITRATE (PF) 100 MCG/2ML IJ SOLN
INTRAMUSCULAR | Status: AC
  Filled 2020-03-20: qty 2

## 2020-03-20 MED ORDER — OXYCODONE HCL 5 MG PO TABS
5.0000 mg | ORAL_TABLET | Freq: Once | ORAL | Status: AC | PRN
  Administered 2020-03-20: 5 mg via ORAL

## 2020-03-20 MED ORDER — SODIUM CHLORIDE 0.9 % IR SOLN
Status: DC | PRN
  Administered 2020-03-20: 3000 mL

## 2020-03-20 MED ORDER — HYDROCODONE-ACETAMINOPHEN 5-325 MG PO TABS: 1.0000 | ORAL_TABLET | Freq: Four times a day (QID) | ORAL | 0 refills | Status: DC | PRN

## 2020-03-20 MED ORDER — FENTANYL CITRATE (PF) 100 MCG/2ML IJ SOLN
INTRAMUSCULAR | Status: DC | PRN
  Administered 2020-03-20 (×2): 100 ug via INTRAVENOUS

## 2020-03-20 MED ORDER — CEFAZOLIN SODIUM-DEXTROSE 2-4 GM/100ML-% IV SOLN
2.0000 g | INTRAVENOUS | Status: AC
  Administered 2020-03-20: 2 g via INTRAVENOUS

## 2020-03-20 MED ORDER — DEXMEDETOMIDINE (PRECEDEX) IN NS 20 MCG/5ML (4 MCG/ML) IV SYRINGE
PREFILLED_SYRINGE | INTRAVENOUS | Status: AC
  Filled 2020-03-20: qty 5

## 2020-03-20 MED ORDER — PROMETHAZINE HCL 25 MG/ML IJ SOLN: 6.2500 mg | INTRAMUSCULAR | Status: DC | PRN

## 2020-03-20 MED ORDER — ONDANSETRON HCL 4 MG/2ML IJ SOLN
INTRAMUSCULAR | Status: DC | PRN
  Administered 2020-03-20: 4 mg via INTRAVENOUS

## 2020-03-20 MED ORDER — OXYCODONE HCL 5 MG PO TABS
ORAL_TABLET | ORAL | Status: AC
  Filled 2020-03-20: qty 1

## 2020-03-20 MED ORDER — ACETAMINOPHEN 500 MG PO TABS
1000.0000 mg | ORAL_TABLET | Freq: Once | ORAL | Status: AC
  Administered 2020-03-20: 1000 mg via ORAL

## 2020-03-20 MED ORDER — BUPIVACAINE-EPINEPHRINE (PF) 0.25% -1:200000 IJ SOLN
INTRAMUSCULAR | Status: DC | PRN
  Administered 2020-03-20: 20 mL

## 2020-03-20 SURGICAL SUPPLY — 33 items
BANDAGE ESMARK 6X9 LF (GAUZE/BANDAGES/DRESSINGS) IMPLANT
BLADE EXCALIBUR 4.0X13 (MISCELLANEOUS) ×2 IMPLANT
BLADE SHAVER TORPEDO 4X13 (MISCELLANEOUS) IMPLANT
BNDG ELASTIC 6X5.8 VLCR STR LF (GAUZE/BANDAGES/DRESSINGS) ×4 IMPLANT
BNDG ESMARK 6X9 LF (GAUZE/BANDAGES/DRESSINGS)
COOLER ICEMAN CLASSIC (MISCELLANEOUS) ×2 IMPLANT
COVER WAND RF STERILE (DRAPES) IMPLANT
CUFF TOURN SGL QUICK 34 (TOURNIQUET CUFF) ×1
CUFF TRNQT CYL 34X4.125X (TOURNIQUET CUFF) ×1 IMPLANT
DRAPE ARTHROSCOPY W/POUCH 90 (DRAPES) ×2 IMPLANT
DRAPE IMP U-DRAPE 54X76 (DRAPES) ×2 IMPLANT
DRAPE U-SHAPE 47X51 STRL (DRAPES) ×2 IMPLANT
DURAPREP 26ML APPLICATOR (WOUND CARE) ×2 IMPLANT
GAUZE SPONGE 4X4 12PLY STRL (GAUZE/BANDAGES/DRESSINGS) ×2 IMPLANT
GAUZE XEROFORM 1X8 LF (GAUZE/BANDAGES/DRESSINGS) ×2 IMPLANT
GLOVE ECLIPSE 7.0 STRL STRAW (GLOVE) ×2 IMPLANT
GLOVE SURG NEOP MICRO LF SZ7.5 (GLOVE) ×2 IMPLANT
GLOVE SURG SYN 7.5  E (GLOVE) ×1
GLOVE SURG SYN 7.5 E (GLOVE) ×1 IMPLANT
GLOVE SURG UNDER POLY LF SZ7 (GLOVE) ×2 IMPLANT
GOWN STRL REIN XL XLG (GOWN DISPOSABLE) ×2 IMPLANT
GOWN STRL REUS W/ TWL LRG LVL3 (GOWN DISPOSABLE) ×1 IMPLANT
GOWN STRL REUS W/ TWL XL LVL3 (GOWN DISPOSABLE) ×1 IMPLANT
GOWN STRL REUS W/TWL LRG LVL3 (GOWN DISPOSABLE) ×1
GOWN STRL REUS W/TWL XL LVL3 (GOWN DISPOSABLE) ×1
MANIFOLD NEPTUNE II (INSTRUMENTS) ×2 IMPLANT
PACK ARTHROSCOPY DSU (CUSTOM PROCEDURE TRAY) ×2 IMPLANT
PACK BASIN DAY SURGERY FS (CUSTOM PROCEDURE TRAY) ×2 IMPLANT
PAD COLD SHLDR WRAP-ON (PAD) ×2 IMPLANT
SHEET MEDIUM DRAPE 40X70 STRL (DRAPES) ×2 IMPLANT
SUT ETHILON 3 0 PS 1 (SUTURE) ×2 IMPLANT
TOWEL GREEN STERILE FF (TOWEL DISPOSABLE) ×2 IMPLANT
TUBING ARTHROSCOPY IRRIG 16FT (MISCELLANEOUS) ×2 IMPLANT

## 2020-03-20 NOTE — Anesthesia Procedure Notes (Signed)
Procedure Name: LMA Insertion Date/Time: 03/20/2020 2:18 PM Performed by: Shanon Payor, CRNA Pre-anesthesia Checklist: Patient identified, Emergency Drugs available, Suction available, Patient being monitored and Timeout performed Patient Re-evaluated:Patient Re-evaluated prior to induction Oxygen Delivery Method: Circle system utilized Preoxygenation: Pre-oxygenation with 100% oxygen LMA: LMA inserted LMA Size: 4.0 Number of attempts: 1 Placement Confirmation: positive ETCO2 and breath sounds checked- equal and bilateral Dental Injury: Teeth and Oropharynx as per pre-operative assessment

## 2020-03-20 NOTE — Transfer of Care (Signed)
Immediate Anesthesia Transfer of Care Note  Patient: Russell Harrington.  Procedure(s) Performed: LEFT KNEE ARTHROSCOPY WITH debridement and synovectomy (Left Knee)  Patient Location: PACU  Anesthesia Type:General  Level of Consciousness: awake, drowsy and patient cooperative  Airway & Oxygen Therapy: Patient Spontanous Breathing and Patient connected to face mask oxygen  Post-op Assessment: Report given to RN and Post -op Vital signs reviewed and stable  Post vital signs: Reviewed and stable  Last Vitals:  Vitals Value Taken Time  BP 109/72 03/20/20 1511  Temp    Pulse 67 03/20/20 1515  Resp 13 03/20/20 1515  SpO2 100 % 03/20/20 1515  Vitals shown include unvalidated device data.  Last Pain:  Vitals:   03/20/20 1247  TempSrc: Oral  PainSc: 7       Patients Stated Pain Goal: 4 (03/20/20 1247)  Complications: No complications documented.

## 2020-03-20 NOTE — Anesthesia Postprocedure Evaluation (Signed)
Anesthesia Post Note  Patient: Russell Harrington.  Procedure(s) Performed: LEFT KNEE ARTHROSCOPY WITH debridement and synovectomy (Left Knee)     Patient location during evaluation: PACU Anesthesia Type: General Level of consciousness: awake and alert Pain management: pain level controlled Vital Signs Assessment: post-procedure vital signs reviewed and stable Respiratory status: spontaneous breathing, nonlabored ventilation and respiratory function stable Cardiovascular status: blood pressure returned to baseline and stable Postop Assessment: no apparent nausea or vomiting Anesthetic complications: no   No complications documented.  Last Vitals:  Vitals:   03/20/20 1545 03/20/20 1600  BP: 120/77 117/82  Pulse: (!) 45 (!) 54  Resp: 14 14  Temp:  36.7 C  SpO2: 100% 99%    Last Pain:  Vitals:   03/20/20 1616  TempSrc:   PainSc: 4                  Beryle Lathe

## 2020-03-20 NOTE — Op Note (Signed)
Surgery Date: 03/20/2020  PREOPERATIVE DIAGNOSES:  1. Left knee lateral meniscus tear 2. Left knee synovitis  POSTOPERATIVE DIAGNOSES:  1. Left knee healed lateral meniscus tear 2. Left knee synovitis 3. Left partial ACL tear  PROCEDURES PERFORMED:  1. Left knee arthroscopy with major synovectomy 2. Left knee arthroscopy with arthroscopic chondroplasty medial and lateral femoral condyle.  SURGEON: N. Glee Arvin, M.D.  ASSIST: Starlyn Ske[place], [place]; necessary for the timely completion of procedure and due to complexity of procedure.  ANESTHESIA:  general  FLUIDS: Per anesthesia record.   ESTIMATED BLOOD LOSS: minimal  DESCRIPTION OF PROCEDURE: Mr. Russell Harrington is a 45 y.o.-year-old male with above mentioned conditions. Full discussion held regarding risks benefits alternatives and complications related surgical intervention. Conservative care options reviewed. All questions answered.  The patient was identified in the preoperative holding area and the operative extremity was marked. The patient was brought to the operating room and transferred to operating table in a supine position. Satisfactory general anesthesia was induced by anesthesiology.    Examination of the knee was first performed under general anesthesia.  He had a flexion contracture of approximately 15 to 20 degrees.  Flexion was normal.  Collaterals and cruciates were normal.  External rotation dial test was normal.  Standard anterolateral, anteromedial arthroscopy portals were obtained. The anteromedial portal was obtained with a spinal needle for localization under direct visualization with subsequent diagnostic findings.   We first began with diagnostic knee arthroscopy which revealed severe synovitis in the femoral notch as well as the medial lateral compartment and the superior patellofemoral compartment.  There is moderate synovitis in the gutters.  Major synovectomy was performed.  The tissue was friable.   Hemostasis was obtained.  We then placed a gentle valgus stress on the knee and the medial compartment was evaluated which was unremarkable.  There was grade I chondromalacia.  The ACL was then evaluated and appear that the anterior medial bundle had likely ruptured but the posterior lateral bundle was intact.  There was a significant amount of inflammation that was adhered to the ACL.  The PCL was intact.  The knee was then placed in a figure 4 position.  There was grade II-III chondromalacia on the medial aspect of the lateral femoral condyle some in the weightbearing surface.  Gentle chondroplasty was performed.  The lateral meniscus was probed throughout and I was not able to identify the tear that was of question on the MRI.  It appeared that the tear had potentially healed.  We then placed the knee in extension and the patellofemoral compartment showed no significant chondromalacia.   Gutters were checked for loose bodies.  Excess fluid was removed from the knee joint.  Incisions were closed with interrupted nylon sutures.  Sterile dressings were applied.  Patient tolerated procedure well had no immediate complications.  Suprapatellar pouch and gutters: severe synovitis or debris. Patella chondral surface: Grade 0 Trochlear chondral surface: Grade 0 Patellofemoral tracking: Normal Medial meniscus: Normal1.  Medial femoral condyle weight bearing surface: Grade 1 Medial tibial plateau: Grade 0 Anterior cruciate ligament: Torn anterior medial bundle and intact posterior lateral bundle Posterior cruciate ligament:stable Lateral meniscus: Normal.   Lateral femoral condyle weight bearing surface: Grade 2-3 Lateral tibial plateau: Grade 1  DISPOSITION: The patient was awakened from general anesthetic, extubated, taken to the recovery room in medically stable condition, no apparent complications. The patient may be weightbearing as tolerated to the operative lower extremity.  Range of motion of right  knee  as tolerated.  Mayra Reel, MD [facility] 3:01 PM

## 2020-03-20 NOTE — Discharge Instructions (Signed)
*You had 1000 mg of Tylenol at 12:55pm  Post-operative patient instructions  Knee Arthroscopy    Ice:  Place intermittent ice or cooler pack over your knee, 30 minutes on and 30 minutes off.  Continue this for the first 72 hours after surgery, then save ice for use after therapy sessions or on more active days.    Weight:  You may bear weight on your leg as your symptoms allow.  Crutches:  Use crutches (or walker) to assist in walking until told to discontinue by your physical therapist or physician. This will help to reduce pain.  Strengthening:  Perform simple thigh squeezes (isometric quad contractions) and straight leg lifts as you are able (3 sets of 5 to 10 repetitions, 3 times a day).  For the leg lifts, have someone support under your ankle in the beginning until you have increased strength enough to do this on your own.  To help get started on thigh squeezes, place a pillow under your knee and push down on the pillow with back of knee (sometimes easier to do than with your leg fully straight).  Motion:  Perform gentle knee motion as tolerated - this is gentle bending and straightening of the knee. Seated heel slides: you can start by sitting in a chair, remove your brace, and gently slide your heel back on the floor - allowing your knee to bend. Have someone help you straighten your knee (or use your other leg/foot hooked under your ankle.   Dressing:  Perform 1st dressing change at 2 days postoperative. A moderate amount of blood tinged drainage is to be expected.  So if you bleed through the dressing on the first or second day or if you have fevers, it is fine to change the dressing/check the wounds early and redress wound. Elevate your leg.  If it bleeds through again, or if the incisions are leaking frank blood, please call the office. May change dressing every 1-2 days thereafter to help watch wounds. Can purchase Tegaderm (or 46M Nexcare) water resistant dressings at local pharmacy  / [facility].  Shower:  Light shower is ok after 2 days.  Please take shower, NO bath. Recover with gauze and ace wrap to help keep wounds protected.    Pain medication:  A narcotic pain medication has been prescribed.  Take as directed.  Typically you need narcotic pain medication more regularly during the first 3 to 5 days after surgery.  Decrease your use of the medication as the pain improves.  Narcotics can sometimes cause constipation, even after a few doses.  If you have problems with constipation, you can take an over the counter stool softener or light laxative.  If you have persistent problems, please notify your physicians office.  Physical therapy: Additional activity guidelines to be provided by your physician or physical therapist at follow-up visits.   Driving: Do not recommend driving x 2 weeks post surgical, especially if surgery performed on right side. Should not drive while taking narcotic pain medications. It typically takes at least 2 weeks to restore sufficient neuromuscular function for normal reaction times for driving safety.   Call [phone] for questions or problems. Evenings you will be forwarded to the hospital operator.  Ask for the orthopaedic physician on call. Please call if you experience:    o Redness, foul smelling, or persistent drainage from the surgical site  o worsening knee pain and swelling not responsive to medication  o any calf pain and or swelling of  the lower leg  o temperatures greater than 101.5 F o other questions or concerns   Thank you for allowing [place] to be a part of your care.   Post Anesthesia Home Care Instructions  Activity: Get plenty of rest for the remainder of the day. A responsible individual must stay with you for 24 hours following the procedure.  For the next 24 hours, DO NOT: -Drive a car -Advertising copywriter -Drink alcoholic beverages -Take any medication unless instructed by your physician -Make any legal decisions or  sign important papers.  Meals: Start with liquid foods such as gelatin or soup. Progress to regular foods as tolerated. Avoid greasy, spicy, heavy foods. If nausea and/or vomiting occur, drink only clear liquids until the nausea and/or vomiting subsides. Call your physician if vomiting continues.  Special Instructions/Symptoms: Your throat may feel dry or sore from the anesthesia or the breathing tube placed in your throat during surgery. If this causes discomfort, gargle with warm salt water. The discomfort should disappear within 24 hours.  If you had a scopolamine patch placed behind your ear for the management of post- operative nausea and/or vomiting:  1. The medication in the patch is effective for 72 hours, after which it should be removed.  Wrap patch in a tissue and discard in the trash. Wash hands thoroughly with soap and water. 2. You may remove the patch earlier than 72 hours if you experience unpleasant side effects which may include dry mouth, dizziness or visual disturbances. 3. Avoid touching the patch. Wash your hands with soap and water after contact with the patch.

## 2020-03-20 NOTE — H&P (Signed)
    PREOPERATIVE H&P  Chief Complaint: LEFT KNEE LATERAL MENISCAL TEAR  HPI: Russell Harrington. is a 45 y.o. male who presents for surgical treatment of LEFT KNEE LATERAL MENISCAL TEAR.  He denies any changes in medical history.  Past Medical History:  Diagnosis Date  . Asthma   . History of COVID-19 01/2019, 12/2019   Past Surgical History:  Procedure Laterality Date  . NO PAST SURGERIES     Social History   Socioeconomic History  . Marital status: Single    Spouse name: Not on file  . Number of children: Not on file  . Years of education: Not on file  . Highest education level: Not on file  Occupational History  . Not on file  Tobacco Use  . Smoking status: Current Every Day Smoker    Packs/day: 0.50    Types: Cigarettes  . Smokeless tobacco: Never Used  Vaping Use  . Vaping Use: Never used  Substance and Sexual Activity  . Alcohol use: Yes    Alcohol/week: 6.0 standard drinks    Types: 6 Cans of beer per week  . Drug use: Yes    Types: Marijuana  . Sexual activity: Not on file  Other Topics Concern  . Not on file  Social History Narrative  . Not on file   Social Determinants of Health   Financial Resource Strain: Not on file  Food Insecurity: Not on file  Transportation Needs: Not on file  Physical Activity: Not on file  Stress: Not on file  Social Connections: Not on file   History reviewed. No pertinent family history. Allergies  Allergen Reactions  . Aspirin Nausea And Vomiting   Prior to Admission medications   Not on File     Positive ROS: All other systems have been reviewed and were otherwise negative with the exception of those mentioned in the HPI and as above.  Physical Exam: General: Alert, no acute distress Cardiovascular: No pedal edema Respiratory: No cyanosis, no use of accessory musculature GI: abdomen soft Skin: No lesions in the area of chief complaint Neurologic: Sensation intact distally Psychiatric: Patient is competent  for consent with normal mood and affect Lymphatic: no lymphedema  MUSCULOSKELETAL: exam stable  Assessment: LEFT KNEE LATERAL MENISCAL TEAR  Plan: Plan for Procedure(s): LEFT KNEE ARTHROSCOPY WITH PARTIAL LATERAL MENISECTOMY  The risks benefits and alternatives were discussed with the patient including but not limited to the risks of nonoperative treatment, versus surgical intervention including infection, bleeding, nerve injury,  blood clots, cardiopulmonary complications, morbidity, mortality, among others, and they were willing to proceed.   Preoperative templating of the joint replacement has been completed, documented, and submitted to the Operating Room personnel in order to optimize intra-operative equipment management.   Glee Arvin, MD 03/20/2020 2:06 PM

## 2020-03-20 NOTE — Anesthesia Preprocedure Evaluation (Addendum)
Anesthesia Evaluation  Patient identified by MRN, date of birth, ID band Patient awake    Reviewed: Allergy & Precautions, NPO status , Patient's Chart, lab work & pertinent test results  History of Anesthesia Complications Negative for: history of anesthetic complications  Airway Mallampati: II  TM Distance: >3 FB Neck ROM: Full    Dental  (+) Dental Advisory Given   Pulmonary asthma , Current Smoker,    Pulmonary exam normal        Cardiovascular negative cardio ROS Normal cardiovascular exam     Neuro/Psych PSYCHIATRIC DISORDERS Depression negative neurological ROS     GI/Hepatic negative GI ROS, Neg liver ROS,   Endo/Other  negative endocrine ROS  Renal/GU negative Renal ROS  negative genitourinary   Musculoskeletal  (+) Arthritis ,   Abdominal   Peds  Hematology negative hematology ROS (+)   Anesthesia Other Findings Hx of opioid, benzo, and cocaine abuse  Reproductive/Obstetrics negative OB ROS                            Anesthesia Physical Anesthesia Plan  ASA: II  Anesthesia Plan: General   Post-op Pain Management:    Induction: Intravenous  PONV Risk Score and Plan: 1 and Treatment may vary due to age or medical condition, Ondansetron, Midazolam and Dexamethasone  Airway Management Planned: LMA  Additional Equipment: None  Intra-op Plan:   Post-operative Plan: Extubation in OR  Informed Consent: I have reviewed the patients History and Physical, chart, labs and discussed the procedure including the risks, benefits and alternatives for the proposed anesthesia with the patient or authorized representative who has indicated his/her understanding and acceptance.     Dental advisory given  Plan Discussed with: CRNA and Anesthesiologist  Anesthesia Plan Comments:        Anesthesia Quick Evaluation

## 2020-03-23 ENCOUNTER — Encounter (HOSPITAL_BASED_OUTPATIENT_CLINIC_OR_DEPARTMENT_OTHER): Payer: Self-pay | Admitting: Orthopaedic Surgery

## 2020-03-24 ENCOUNTER — Encounter: Payer: Self-pay | Admitting: Gerontology

## 2020-03-24 ENCOUNTER — Other Ambulatory Visit: Payer: Self-pay

## 2020-03-24 ENCOUNTER — Ambulatory Visit: Payer: Self-pay | Admitting: Gerontology

## 2020-03-24 VITALS — BP 118/86 | HR 52 | Resp 16

## 2020-03-24 DIAGNOSIS — R001 Bradycardia, unspecified: Secondary | ICD-10-CM | POA: Insufficient documentation

## 2020-03-24 DIAGNOSIS — F17200 Nicotine dependence, unspecified, uncomplicated: Secondary | ICD-10-CM

## 2020-03-24 DIAGNOSIS — G8929 Other chronic pain: Secondary | ICD-10-CM

## 2020-03-24 NOTE — Progress Notes (Signed)
Established Patient Office Visit  Subjective:  Patient ID: Russell Harrington., male    DOB: [birth year]  Age: 45 y.o. MRN: [id number]  CC: No chief complaint on file.    HPI Russell Harrington. presents for follow up of left knee pain. He had left knee arthroscopy with major synovectomy and left knee with chondroplasty medial and lateral femoral condyle done on 03/20/2020 by Dr Roda Shutters, [facility]. He states that he continues to experience with weight bearing, and it " feels like my knee is not connecting and will buckle with applying pressure". He denies swelling, erythema and discharge from the incision site, he changed the dressing today, wrapped with Ace wrap. He ambulates with crutches. His heart rate was 52 bpm, he states that it's chronic, denies chest pain, fatigue, shortness of breath and dizziness. Overall, he states that he's doing well, will follow up with Orthopedic Surgery on 04/01/20 and he offers no further complaint.  Past Medical History:  Diagnosis Date  . Asthma   . History of COVID-19 01/2019, 12/2019    Past Surgical History:  Procedure Laterality Date  . KNEE ARTHROSCOPY WITH MEDIAL MENISECTOMY Left 03/20/2020   Procedure: LEFT KNEE ARTHROSCOPY WITH debridement and synovectomy;  Surgeon: Tarry Kos, MD;  Location: [facility];  Service: Orthopedics;  Laterality: Left;  . NO PAST SURGERIES      No family history on file.  Social History   Socioeconomic History  . Marital status: Single    Spouse name: Not on file  . Number of children: Not on file  . Years of education: Not on file  . Highest education level: Not on file  Occupational History  . Not on file  Tobacco Use  . Smoking status: Current Every Day Smoker    Packs/day: 0.50    Types: Cigarettes  . Smokeless tobacco: Never Used  Vaping Use  . Vaping Use: Never used  Substance and Sexual Activity  . Alcohol use: Yes    Alcohol/week: 6.0 standard drinks    Types: 6 Cans of beer per week  .  Drug use: Yes    Types: Marijuana  . Sexual activity: Not on file  Other Topics Concern  . Not on file  Social History Narrative  . Not on file   Social Determinants of Health   Financial Resource Strain: Not on file  Food Insecurity: Not on file  Transportation Needs: Not on file  Physical Activity: Not on file  Stress: Not on file  Social Connections: Not on file  Intimate Partner Violence: Not on file    Outpatient Medications Prior to Visit  Medication Sig Dispense Refill  . HYDROcodone-acetaminophen (NORCO) 5-325 MG tablet Take 1 tablet by mouth every 6 (six) hours as needed. 20 tablet 0   No facility-administered medications prior to visit.    Allergies  Allergen Reactions  . Aspirin Nausea And Vomiting    ROS Review of Systems  Constitutional: Negative.   Respiratory: Negative.   Cardiovascular: Negative.   Musculoskeletal: Positive for arthralgias (left knee arthroplasty).  Neurological: Negative.       Objective:    Physical Exam HENT:     Head: Normocephalic and atraumatic.  Cardiovascular:     Rate and Rhythm: Bradycardia present.     Pulses: Normal pulses.     Heart sounds: Normal heart sounds.  Pulmonary:     Effort: Pulmonary effort is normal.     Breath sounds: Normal breath sounds.  Musculoskeletal:  General: Tenderness (Tenderness when he stands from a sitting position) present.  Neurological:     General: No focal deficit present.     Mental Status: He is alert and oriented to person, place, and time. Mental status is at baseline.     BP 118/86 (BP Location: Left Arm, Patient Position: Sitting, Cuff Size: Large)   Pulse (!) 52   Resp 16   SpO2 99%  Wt Readings from Last 3 Encounters:  03/20/20 189 lb 13.1 oz (86.1 kg)  01/09/20 182 lb 14.4 oz (83 kg)  12/19/19 177 lb 4.8 oz (80.4 kg)     Health Maintenance Due  Topic Date Due  . Hepatitis C Screening  Never done  . HIV Screening  Never done  . TETANUS/TDAP  Never done     There are no preventive care reminders to display for this patient.  Lab Results  Component Value Date   TSH 2.360 01/02/2020   Lab Results  Component Value Date   WBC 12.5 (H) 01/02/2020   HGB 15.1 01/02/2020   HCT 45.6 01/02/2020   MCV 85 01/02/2020   PLT 228 01/02/2020   Lab Results  Component Value Date   NA 143 01/02/2020   K 4.6 01/02/2020   CO2 25 01/02/2020   GLUCOSE 94 01/02/2020   BUN 10 01/02/2020   CREATININE 1.17 01/02/2020   BILITOT 0.6 01/02/2020   ALKPHOS 49 01/02/2020   AST 14 01/02/2020   ALT 12 01/02/2020   PROT 5.9 (L) 01/02/2020   ALBUMIN 3.8 (L) 01/02/2020   CALCIUM 9.0 01/02/2020   ANIONGAP 9 12/29/2017   Lab Results  Component Value Date   CHOL 197 01/02/2020   Lab Results  Component Value Date   HDL 56 01/02/2020   Lab Results  Component Value Date   LDLCALC 91 01/02/2020   Lab Results  Component Value Date   TRIG 302 (H) 01/02/2020   Lab Results  Component Value Date   CHOLHDL 3.5 01/02/2020   Lab Results  Component Value Date   HGBA1C 5.8 (H) 01/02/2020      Assessment & Plan:    1. Chronic pain of left knee - He is post op day 4 and he will continue on discharge instructions. He was advised t notify Orthopedic Surgeon for worsening pain, swelling, erythema and discharge from surgical site of his left knee.  2. Tobacco use disorder - He was encouraged on smoking cessation and was provided with [place] Quitline information.  3. Decreased heart rate - His heart rate was 52 bpm, he sates that it's chronic, denies shortness of breath, fatigue and light headedness. He was advised to notify clinic or go to the ED for worsening symptoms.    Follow-up: Return in about 3 months (around 06/24/2020), or if symptoms worsen or fail to improve.    Masiya Claassen Trellis Paganini, NP

## 2020-04-02 ENCOUNTER — Other Ambulatory Visit: Payer: Self-pay | Admitting: Physician Assistant

## 2020-04-02 ENCOUNTER — Ambulatory Visit (INDEPENDENT_AMBULATORY_CARE_PROVIDER_SITE_OTHER): Payer: Self-pay | Admitting: Physician Assistant

## 2020-04-02 ENCOUNTER — Telehealth: Payer: Self-pay | Admitting: Orthopaedic Surgery

## 2020-04-02 ENCOUNTER — Encounter: Payer: Self-pay | Admitting: Physician Assistant

## 2020-04-02 DIAGNOSIS — Z9889 Other specified postprocedural states: Secondary | ICD-10-CM

## 2020-04-02 MED ORDER — HYDROCODONE-ACETAMINOPHEN 5-325 MG PO TABS: 1.0000 | ORAL_TABLET | Freq: Four times a day (QID) | ORAL | 0 refills | Status: DC | PRN

## 2020-04-02 NOTE — Telephone Encounter (Signed)
Tell him I am sorry that I forgot to call in when he was here.  I just sent in

## 2020-04-02 NOTE — Progress Notes (Signed)
Post-Op Visit Note   Patient: Russell Harrington.           Date of Birth: January 10, 1976           MRN: [id number] Visit Date: 04/02/2020 PCP: Patient, No Pcp Per   Assessment & Plan:  Chief Complaint:  Chief Complaint  Patient presents with  . Left Knee - Pain   Visit Diagnoses:  1. S/P arthroscopy of left knee     Plan: [name] who comes in today 2 weeks out left knee arthroscopic debridement medial lateral femoral condyle and major synovectomy.  It was noted during operative intervention that he had a partial ACL tear.  He is still in a fair amount of pain.  He still has trouble with extending his knee.  Examination of his left knee shows fully healed surgical portals without complication.  No evidence of infection or cellulitis.  Calf is soft nontender.  He does still exhibit about a 45 degree flexion contracture and is able to flex to about 90 degrees.  He is neurovascularly intact distally.  At this point, sutures were removed and Steri-Strips applied.  Intraoperative pictures provided.  I have provided the patient with a home exercise program but have also encouraged him to start outpatient physical therapy.  Internal referral has been made.  I have refilled his Norco.  He will follow up with [place] in 2 weeks time to further discuss the flexion contracture with [name].  Call with concerns or questions in the meantime.  Follow-Up Instructions: Return in about 2 weeks (around 04/16/2020).   Orders:  No orders of the defined types were placed in this encounter.  No orders of the defined types were placed in this encounter.   Imaging: No new imaging  PMFS History: Patient Active Problem List   Diagnosis Date Noted  . Decreased heart rate 03/24/2020  . Synovitis of left knee   . Acute lateral meniscus tear of left knee 03/10/2020  . Acute torn meniscus of knee 02/11/2020  . Prediabetes 01/09/2020  . Encounter to establish care 12/19/2019  . Left  knee pain 12/19/2019  . Cannabis use disorder, moderate, 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  . Sedative, hypnotic or anxiolytic use disorder, severe, 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  . Opioid use disorder, moderate, dependence (HCC) 12/28/2017  . Severe recurrent major depression without psychotic features (HCC) 12/28/2017  . Suicidal ideation 12/28/2017  . Cocaine use disorder, moderate, dependence (HCC) 12/28/2017  . Tobacco use disorder 12/28/2017   Past Medical History:  Diagnosis Date  . Asthma   . History of COVID-19 01/2019, 12/2019    History reviewed. No pertinent family history.  Past Surgical History:  Procedure Laterality Date  . KNEE ARTHROSCOPY WITH MEDIAL MENISECTOMY Left 03/20/2020   Procedure: LEFT KNEE ARTHROSCOPY WITH debridement and synovectomy;  Surgeon: Tarry Kos, MD;  Location: [facility];  Service: Orthopedics;  Laterality: Left;  . NO PAST SURGERIES     Social History   Occupational History  . Not on file  Tobacco Use  . Smoking status: Current Every Day Smoker    Packs/day: 0.50    Types: Cigarettes  . Smokeless tobacco: Never Used  Vaping Use  . Vaping Use: Never used  Substance and Sexual Activity  . Alcohol use: Yes    Alcohol/week: 6.0 standard drinks    Types: 6 Cans of beer per week  . Drug use: Yes    Types: Marijuana  . Sexual activity:  Not on file

## 2020-04-02 NOTE — Telephone Encounter (Signed)
Patient called requesting a refill of hydrocodone. Please send to pharmacy on file. Patient phone number is [phone].

## 2020-04-16 ENCOUNTER — Ambulatory Visit (INDEPENDENT_AMBULATORY_CARE_PROVIDER_SITE_OTHER): Payer: Self-pay | Admitting: Physician Assistant

## 2020-04-16 ENCOUNTER — Other Ambulatory Visit: Payer: Self-pay

## 2020-04-16 DIAGNOSIS — Z9889 Other specified postprocedural states: Secondary | ICD-10-CM

## 2020-04-16 NOTE — Progress Notes (Unsigned)
   Post-Op Visit Note   Patient: Russell Harrington.           Date of Birth: April 15, 1975           MRN: [id number] Visit Date: 04/16/2020 PCP: Patient, No Pcp Per   Assessment & Plan:  Chief Complaint:  Chief Complaint  Patient presents with  . Left Knee - Routine Post Op   Visit Diagnoses:  1. S/P left knee arthroscopy     Plan: [name] who comes in today approximately 4 weeks out left knee arthroscopic debridement and major synovectomy.  Noted during operative intervention was a partial ACL tear.  He has been doing okay.  He has minimal pain but he is still limited by his range of motion.  He has not yet been to physical therapy as he has not been contacted to schedule an appointment.  Examination of his left knee reveals range of motion from about 40 to 95 degrees.  He is neurovascular intact distally.  At this point, we have put in another referral for formal physical therapy.  The patient will let us know if he is not contacted within the week.  We will follow up with [place] in 6 weeks time for recheck.  Call with concerns or questions. Follow-Up Instructions: Return in about 6 weeks (around 05/28/2020).   Orders:  Orders Placed This Encounter  Procedures  . Ambulatory referral to Physical Therapy   No orders of the defined types were placed in this encounter.   Imaging: No new imaging  PMFS History: Patient Active Problem List   Diagnosis Date Noted  . Decreased heart rate 03/24/2020  . Synovitis of left knee   . Acute lateral meniscus tear of left knee 03/10/2020  . Acute torn meniscus of knee 02/11/2020  . Prediabetes 01/09/2020  . Encounter to establish care 12/19/2019  . Left knee pain 12/19/2019  . Cannabis use disorder, moderate, 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  . Sedative, hypnotic or anxiolytic use disorder, severe, 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  . Opioid use disorder, moderate, dependence (HCC) 12/28/2017  . Severe recurrent major  depression without psychotic features (HCC) 12/28/2017  . Suicidal ideation 12/28/2017  . Cocaine use disorder, moderate, dependence (HCC) 12/28/2017  . Tobacco use disorder 12/28/2017   Past Medical History:  Diagnosis Date  . Asthma   . History of COVID-19 01/2019, 12/2019    No family history on file.  Past Surgical History:  Procedure Laterality Date  . KNEE ARTHROSCOPY WITH MEDIAL MENISECTOMY Left 03/20/2020   Procedure: LEFT KNEE ARTHROSCOPY WITH debridement and synovectomy;  Surgeon: Tarry Kos, MD;  Location: [facility];  Service: Orthopedics;  Laterality: Left;  . NO PAST SURGERIES     Social History   Occupational History  . Not on file  Tobacco Use  . Smoking status: Current Every Day Smoker    Packs/day: 0.50    Types: Cigarettes  . Smokeless tobacco: Never Used  Vaping Use  . Vaping Use: Never used  Substance and Sexual Activity  . Alcohol use: Yes    Alcohol/week: 6.0 standard drinks    Types: 6 Cans of beer per week  . Drug use: Yes    Types: Marijuana  . Sexual activity: Not on file

## 2020-04-28 ENCOUNTER — Ambulatory Visit (HOSPITAL_COMMUNITY)
Admission: RE | Admit: 2020-04-28 | Discharge: 2020-04-28 | Disposition: A | Discharge status: Home / Self Care | Payer: Self-pay | Source: Ambulatory Visit | Attending: Orthopaedic Surgery | Admitting: Orthopaedic Surgery

## 2020-04-28 ENCOUNTER — Other Ambulatory Visit: Payer: Self-pay

## 2020-04-28 ENCOUNTER — Encounter: Payer: Self-pay | Admitting: Orthopaedic Surgery

## 2020-04-28 ENCOUNTER — Ambulatory Visit: Payer: Self-pay | Admitting: Orthopaedic Surgery

## 2020-04-28 ENCOUNTER — Ambulatory Visit (INDEPENDENT_AMBULATORY_CARE_PROVIDER_SITE_OTHER): Payer: Self-pay | Admitting: Orthopaedic Surgery

## 2020-04-28 DIAGNOSIS — M79605 Pain in left leg: Secondary | ICD-10-CM | POA: Insufficient documentation

## 2020-04-28 DIAGNOSIS — M5432 Sciatica, left side: Secondary | ICD-10-CM

## 2020-04-28 MED ORDER — PREDNISONE 10 MG (21) PO TBPK: ORAL_TABLET | ORAL | 0 refills | Status: DC

## 2020-04-28 MED ORDER — METHOCARBAMOL 500 MG PO TABS: 500.0000 mg | ORAL_TABLET | Freq: Two times a day (BID) | ORAL | 0 refills | Status: DC | PRN

## 2020-04-28 NOTE — Progress Notes (Signed)
Left lower extremity venous duplex has been completed. Preliminary results can be found in CV Proc through chart review.  Results were given to [facility] at Dr. Warren Danes office.  04/28/20 1:56 PM Olen Cordial RVT

## 2020-04-28 NOTE — Progress Notes (Signed)
Office Visit Note   Patient: Russell Harrington.           Date of Birth: 08/07/1975           MRN: [id number] Visit Date: 04/28/2020              Requested by: No referring provider defined for this encounter. PCP: Patient, No Pcp Per   Assessment & Plan: Visit Diagnoses:  1. Sciatica, left side   2. Pain in left leg     Plan: Impression is left lower extremity sciatica.  Of started the patient on a steroid taper and muscle relaxer.  We will also add this to his current physical therapy prescription for which she is yet to start for his left knee.  For the left calf pain, we have referred him out for venous Doppler ultrasound to rule out DVT.  He has been instructed to go to the ED should he develop chest pain or shortness of breath.  Follow-Up Instructions: Return in about 4 weeks (around 05/26/2020).   Orders:  Orders Placed This Encounter  Procedures  . VAS [place] LOWER EXTREMITY VENOUS (DVT)   No orders of the defined types were placed in this encounter.     Procedures: No procedures performed   Clinical Data: No additional findings.   Subjective: Chief Complaint  Patient presents with  . Left Knee - Follow-up    HPI patient is a pleasant 45 year old gentleman who comes in today with left lower extremity numbness and tingling for the past week.  His symptoms began at the back of his thigh and radiate down into his ankle and toes.  No new injury or change in activity, but he does note a very remote history of injuring his back while dead lifting in Jun 29, 2003.  He has had on and off back pain since.  He has recently started getting weakness to the left lower extremity.  His symptoms are worse with lying on his left side.  No bowel or bladder change or saddle paresthesias.  He is also complaining of left calf pain and swelling.  He is 4 weeks out left knee arthroscopy.  No chest pain or shortness of breath.  He does have a 25-year pack history.  No personal or family history of DVT  or PE.  Review of Systems as detailed in HPI.  All others reviewed and are negative.   Objective: Vital Signs: There were no vitals taken for this visit.  Physical Exam well-developed well-nourished gentleman in no acute distress.  Alert oriented x3.  Ortho Exam x-rays of the left lower extremity revealed positive straight leg raise.  Marked tenderness to the calf with mild swelling.  Positive Homans.  No pain to the lumbar spine or paraspinous musculature.  No pain with lumbar flexion or extension.  He is neurovascular intact distally.  Specialty Comments:  No specialty comments available.  Imaging: Remote imaging of the lumbar spine shows straightening.  No other acute findings.   PMFS History: Patient Active Problem List   Diagnosis Date Noted  . Decreased heart rate 03/24/2020  . Synovitis of left knee   . Acute lateral meniscus tear of left knee 03/10/2020  . Acute torn meniscus of knee 02/11/2020  . Prediabetes 01/09/2020  . Encounter to establish care 12/19/2019  . Left knee pain 12/19/2019  . Cannabis use disorder, moderate, 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  . Sedative, hypnotic or anxiolytic use disorder, severe, 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  . Opioid use disorder, moderate,  dependence (HCC) 12/28/2017  . Severe recurrent major depression without psychotic features (HCC) 12/28/2017  . Suicidal ideation 12/28/2017  . Cocaine use disorder, moderate, dependence (HCC) 12/28/2017  . Tobacco use disorder 12/28/2017   Past Medical History:  Diagnosis Date  . Asthma   . History of COVID-19 01/2019, 12/2019    History reviewed. No pertinent family history.  Past Surgical History:  Procedure Laterality Date  . KNEE ARTHROSCOPY WITH MEDIAL MENISECTOMY Left 03/20/2020   Procedure: LEFT KNEE ARTHROSCOPY WITH debridement and synovectomy;  Surgeon: Tarry Kos, MD;  Location: [facility];  Service: Orthopedics;  Laterality: Left;  . NO PAST SURGERIES     Social  History   Occupational History  . Not on file  Tobacco Use  . Smoking status: Current Every Day Smoker    Packs/day: 0.50    Types: Cigarettes  . Smokeless tobacco: Never Used  Vaping Use  . Vaping Use: Never used  Substance and Sexual Activity  . Alcohol use: Yes    Alcohol/week: 6.0 standard drinks    Types: 6 Cans of beer per week  . Drug use: Yes    Types: Marijuana  . Sexual activity: Not on file

## 2020-04-29 ENCOUNTER — Encounter: Payer: Self-pay | Admitting: Orthopaedic Surgery

## 2020-04-29 ENCOUNTER — Encounter: Payer: Self-pay | Admitting: Gerontology

## 2020-04-29 ENCOUNTER — Telehealth: Payer: Self-pay | Admitting: Orthopaedic Surgery

## 2020-04-29 NOTE — Telephone Encounter (Signed)
Patient advised he is due in court 05/15/2020 for child support and the court want to know why he is not working. Patient asked if the note can state why he has been out of work for a year. Patient asked if the note can be e-mailed to him. The e-mail address is [email or web address]. The number to contact patient is [phone]

## 2020-04-29 NOTE — Telephone Encounter (Signed)
Called patient no answer LMOM to return call. Need to advise on message below.

## 2020-04-29 NOTE — Telephone Encounter (Signed)
Please write letter for him that he is released back to work effective 04/28/20.  Thanks.

## 2020-04-29 NOTE — Telephone Encounter (Signed)
Patient called back. He says he would like a note with work status for now. The current time. His call back number is [phone]

## 2020-04-29 NOTE — Telephone Encounter (Signed)
We can provide him with a reason to be out of work starting from the first time we saw him in our office but there is no way I can justify him being out of work for an entire year.

## 2020-04-29 NOTE — Telephone Encounter (Signed)
If yes, what would you like to put on note.

## 2020-04-30 ENCOUNTER — Encounter: Payer: Self-pay | Admitting: Orthopaedic Surgery

## 2020-04-30 ENCOUNTER — Telehealth: Payer: Self-pay | Admitting: Orthopaedic Surgery

## 2020-04-30 NOTE — Telephone Encounter (Signed)
Patient called requesting an updated work of work note. Patient states he still feels like he is unable to return to work. Patient states he just started physical therapy and his knee locks up a lot. Patient states he is in constant pain and work requires heavy lifting which he is unable to do. Patient is asking for a return call from [name] or [name] about this matter. Patient phone number is [phone].

## 2020-04-30 NOTE — Telephone Encounter (Signed)
That is fine.  We can keep him out of work for 6 weeks for him to do physical therapy.

## 2020-05-01 ENCOUNTER — Other Ambulatory Visit: Payer: Self-pay

## 2020-05-01 ENCOUNTER — Ambulatory Visit: Payer: Self-pay | Attending: Physician Assistant

## 2020-05-01 DIAGNOSIS — G8929 Other chronic pain: Secondary | ICD-10-CM | POA: Insufficient documentation

## 2020-05-01 DIAGNOSIS — M25562 Pain in left knee: Secondary | ICD-10-CM | POA: Insufficient documentation

## 2020-05-01 DIAGNOSIS — M6281 Muscle weakness (generalized): Secondary | ICD-10-CM | POA: Insufficient documentation

## 2020-05-01 DIAGNOSIS — M25662 Stiffness of left knee, not elsewhere classified: Secondary | ICD-10-CM | POA: Insufficient documentation

## 2020-05-01 NOTE — Therapy (Signed)
[facility] [address] [place], [place], [address] Phone: [phone]   Fax:  [phone]  Physical Therapy Evaluation  Patient Details  Name: Russell Harrington. MRN: [id number] Date of Birth: 01-12-1976 Referring Provider (PT): Jari Sportsman, [place]   Encounter Date: 05/01/2020   PT End of Session - 05/01/20 1512    Visit Number 1    Number of Visits 16    Date for PT Re-Evaluation 06/26/20    PT Start Time 1020    PT Stop Time 1101    PT Time Calculation (min) 41 min    Activity Tolerance Patient tolerated treatment well;Patient limited by pain    Behavior During Therapy [facility] for tasks assessed/performed           Past Medical History:  Diagnosis Date  . Asthma   . History of COVID-19 01/2019, 12/2019    Past Surgical History:  Procedure Laterality Date  . KNEE ARTHROSCOPY WITH MEDIAL MENISECTOMY Left 03/20/2020   Procedure: LEFT KNEE ARTHROSCOPY WITH debridement and synovectomy;  Surgeon: Tarry Kos, MD;  Location: [facility];  Service: Orthopedics;  Laterality: Left;  . NO PAST SURGERIES      There were no vitals filed for this visit.    Subjective Assessment - 05/01/20 1034    Subjective Pt reports falling on his knee on concrete May 28, 2019. He had since had pain and inability to work eventually. MRI showed lateral meniscus tear and arthroscopy revealed partial ACL tear of anterior bundle with extensive inflammation, chondromalacia, and lateral posterior horn meniscus tear. Surgery performed for partial lateral menisectomy with surgeon finding 15-20 degree flexion contracture, which pt rpeorts surgeon was unable to straighten. He is very concerned about his knee "feeling out ofhis place" and he cannot straighten it. He was working two jobs and it's been hard to sit around and not work.    Limitations Walking;Standing;Lifting;House hold activities    How long can you sit comfortably? Knee locks up on  him    How long can you stand comfortably? always hurts, shifts to right foot    How long can you walk comfortably? always hurts    Diagnostic tests MRI: partial lateral meniscus posterior horn tear    Patient Stated Goals to get knee straight and reduce pain    Currently in Pain? Yes    Pain Score 7     Pain Location Knee    Pain Orientation Left;Anterior    Pain Descriptors / Indicators Other (Comment)   "if you had gotten hit in your knee", aggravating   Pain Type Chronic pain;Surgical pain    Pain Onset More than a month ago    Pain Frequency Intermittent    Aggravating Factors  walking, laying down, sleeping on it    Pain Relieving Factors marijuana    Effect of Pain on Daily Activities cannot work, difficulty walking              [facility] PT Assessment - 05/01/20 0001      Assessment   Medical Diagnosis L knee arthroscopy and partial lateral menisectomy    Referring Provider (PT) Jari Sportsman, PA-C    Onset Date/Surgical Date 03/10/20    Hand Dominance Left   ambidextrous   Next MD Visit unknown    Prior Therapy None      Precautions   Precautions None      Restrictions   Weight Bearing Restrictions No      Balance  Screen   Has the patient fallen in the past 6 months No    Has the patient had a decrease in activity level because of a fear of falling?  No    Is the patient reluctant to leave their home because of a fear of falling?  No      Prior Function   Level of Independence Independent   sometimes, has help with L sock/shoe   Vocation Unemployed    Leisure chop wood for you dad, go back to being active      Observation/Other Assessments   Focus on Therapeutic Outcomes (FOTO)  36% function, 63% predicted      Posture/Postural Control   Posture Comments flexed posture, weight shift to RLE, LLE flexed in stance      ROM / Strength   AROM / PROM / Strength AROM;PROM;Strength      AROM   AROM Assessment Site Knee    Right/Left Knee Left;Right    Right  Knee Extension 0    Right Knee Flexion 130    Left Knee Extension 50   pain   Left Knee Flexion 100   pain     PROM   Overall PROM Comments poor tolerance      Strength   Strength Assessment Site Knee    Right/Left Knee Right;Left    Right Knee Flexion 4+/5    Right Knee Extension 4+/5    Left Knee Flexion 3+/5    Left Knee Extension 3-/5      Palpation   Patella mobility hypomobile, barely moving    Palpation comment significant TTP/hypersensitive with any touch to knee or peripatellar, including popliteal fossa      Transfers   Comments impaired balance from sit to stand      Ambulation/Gait   Ambulation/Gait Yes    Ambulation/Gait Assistance 5: Supervision    Ambulation Distance (Feet) 50 Feet    Assistive device None    Gait Comments significant knee flexion, decrease weight bearing L LE, absent IC/heel strike and push-off, flexed posture, no AD                      Objective measurements completed on examination: See above findings.               PT Education - 05/01/20 1505    Education Details FOTO, diagnosis, prognosis, HEP, POC    Person(s) Educated Patient    Methods Explanation;Tactile cues;Demonstration;Handout;Verbal cues    Comprehension Verbalized understanding;Returned demonstration;Verbal cues required;Tactile cues required            PT Short Term Goals - 05/01/20 1222      PT SHORT TERM GOAL #1   Title Pt will be I and compliant with initial HEP.    Baseline provided at eval    Time 3    Period Weeks    Status New    Target Date 05/22/20      PT SHORT TERM GOAL #2   Title Pt will improve AROM L knee extension to </= 30    Baseline 50    Time 3    Period Weeks    Status New    Target Date 05/22/20      PT SHORT TERM GOAL #3   Title Pt will increase AROM L knee flexion to >/= 110.    Baseline 100    Time 3    Period Weeks    Status New  Target Date 05/22/20             PT Long Term Goals -  05/01/20 1223      PT LONG TERM GOAL #1   Title Pt will improve L knee extension to at least 15 deficit or less for improved gait and quad firing.    Baseline antalgic gait, 50    Time 8    Period Weeks    Status New    Target Date 06/26/20      PT LONG TERM GOAL #2   Title Pt will improve L knee flexion to 120, as needed for transfers and ADLs.    Baseline 100    Time 8    Period Weeks    Status New    Target Date 06/26/20      PT LONG TERM GOAL #3   Title Pt will negotiate stairs with 0-1 HR safely and with reciprocal gait.    Baseline nonreciprocal, pain    Time 8    Period Weeks    Status New    Target Date 06/26/20      PT LONG TERM GOAL #4   Title Pt will increase L knee MMT to at least 4+/5 for improved knee stability.    Baseline ext 3-/5, flex 3+/5    Time 8    Period Weeks    Status New    Target Date 06/26/20      PT LONG TERM GOAL #5   Title Pt will be able to return to work and leisurely activity with minimal pain.    Baseline not working    Time 8    Period Weeks    Status New    Target Date 06/26/20                  Plan - 05/01/20 1226    Clinical Impression Statement Pt is a 45 yo male who presents s/p L knee lateral menisectomy and arthroscopy on 03/10/20. He has high pain levels and significant ROM deficits 50-100, hypersensitivity to touch, crepitus, poor patellar mobility, apprehension and fear of movement, decreased strength, antalgic gait, impaired balance and transfers. He was educated on importance of movement to increase ROM and decrease pain, diagnosis, prognosis, HEP, POC, and FOTO. Pt verbalized understanding and consent to tx. He could benefit from skilled PT 2x/week for 6-8 weeks to addresss impairments and restore mobility.    Personal Factors and Comorbidities Past/Current Experience;Time since onset of injury/illness/exacerbation    Examination-Activity Limitations Locomotion  Level;Stairs;Squat;Sleep;Sit;Stand;Bend;Lift;Transfers    Examination-Participation Restrictions Community Activity;Shop;Driving;Meal Prep;Interpersonal Relationship    Stability/Clinical Decision Making Evolving/Moderate complexity    Clinical Decision Making Moderate    Rehab Potential Fair    PT Frequency 2x / week    PT Duration 8 weeks    PT Treatment/Interventions ADLs/Self Care Home Management;Moist Heat;Iontophoresis 4mg /ml Dexamethasone;Electrical Stimulation;Cryotherapy;Joint Manipulations;Vasopneumatic Device;Passive range of motion;Dry needling;Taping;Scar mobilization;Energy conservation;Manual techniques;Patient/family education;Neuromuscular re-education;Balance training;Therapeutic exercise;Gait training;Stair training;Functional mobility training;Therapeutic activities    PT Next Visit Plan Assess response to HEP/update PRN, knee ROM via ther ex and manual as tolerated, gait, strengthening    PT Home Exercise Plan heel slides supine/sitting, knee ext with heel propped on chair, AAROM LAQ, active HS stretch    Consulted and Agree with Plan of Care Patient           Patient will benefit from skilled therapeutic intervention in order to improve the following deficits and impairments:  Abnormal gait,Decreased balance,Decreased mobility,Difficulty walking,Pain,Postural dysfunction,Impaired  flexibility,Increased fascial restricitons,Decreased strength,Improper body mechanics,Impaired perceived functional ability,Decreased activity tolerance,Decreased range of motion,Hypomobility,Decreased endurance  Visit Diagnosis: Chronic pain of left knee - Plan: PT plan of care cert/re-cert  Stiffness of left knee, not elsewhere classified - Plan: PT plan of care cert/re-cert  Muscle weakness (generalized) - Plan: PT plan of care cert/re-cert     Problem List Patient Active Problem List   Diagnosis Date Noted  . Decreased heart rate 03/24/2020  . Synovitis of left knee   . Acute  lateral meniscus tear of left knee 03/10/2020  . Acute torn meniscus of knee 02/11/2020  . Prediabetes 01/09/2020  . Encounter to establish care 12/19/2019  . Left knee pain 12/19/2019  . Cannabis use disorder, moderate, 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  . Sedative, hypnotic or anxiolytic use disorder, severe, 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  . Opioid use disorder, moderate, dependence (HCC) 12/28/2017  . Severe recurrent major depression without psychotic features (HCC) 12/28/2017  . Suicidal ideation 12/28/2017  . Cocaine use disorder, moderate, dependence (HCC) 12/28/2017  . Tobacco use disorder 12/28/2017    Marcelline MatesKathryn M Deric Bocock, PT, DPT 05/01/2020, 3:13 PM  [facility] [address] [place], [place], [id number] Phone: [phone]   Fax:  [phone]  Name: Russell MoraleBarney Shellhammer Jr. MRN: [id number] Date of Birth: 01/22/1976

## 2020-05-04 ENCOUNTER — Ambulatory Visit: Payer: Self-pay | Admitting: Physical Therapy

## 2020-05-04 ENCOUNTER — Telehealth: Payer: Self-pay | Admitting: Physical Therapy

## 2020-05-04 NOTE — Telephone Encounter (Signed)
Male answered the phone asking if the patient called and cancelled his appointment.  I stated we had not received any calls. She reported that in the future he will call to cancel his appointments. I asked if they are going to cancel they call as early in advance before their appointment. She verbalized understanding.  Bobbye Reinitz PT, DPT, LAT, ATC  05/04/20  11:34 AM

## 2020-05-06 ENCOUNTER — Ambulatory Visit: Payer: Self-pay | Admitting: Physical Therapy

## 2020-05-12 ENCOUNTER — Telehealth: Payer: Self-pay | Admitting: Orthopaedic Surgery

## 2020-05-12 ENCOUNTER — Encounter: Payer: Self-pay | Admitting: Physical Therapy

## 2020-05-12 NOTE — Telephone Encounter (Signed)
Patient called asked for a call back concerning an updated work note. Patient said he need the work note by Friday. Please see 04/30/2020 note. The number to contact patient is [phone]

## 2020-05-12 NOTE — Telephone Encounter (Signed)
What kind of work note does he need?

## 2020-05-13 ENCOUNTER — Other Ambulatory Visit: Payer: Self-pay

## 2020-05-13 NOTE — Telephone Encounter (Signed)
Looks like out of work for 6 weeks from 3/3

## 2020-05-13 NOTE — Telephone Encounter (Signed)
Work note ready for pick up at the front desk.

## 2020-05-15 ENCOUNTER — Ambulatory Visit: Payer: Self-pay | Admitting: Physical Therapy

## 2020-05-15 ENCOUNTER — Encounter: Payer: Self-pay | Admitting: Physical Therapy

## 2020-05-15 ENCOUNTER — Other Ambulatory Visit: Payer: Self-pay

## 2020-05-15 DIAGNOSIS — M6281 Muscle weakness (generalized): Secondary | ICD-10-CM

## 2020-05-15 DIAGNOSIS — M25562 Pain in left knee: Secondary | ICD-10-CM

## 2020-05-15 DIAGNOSIS — G8929 Other chronic pain: Secondary | ICD-10-CM

## 2020-05-15 DIAGNOSIS — M25662 Stiffness of left knee, not elsewhere classified: Secondary | ICD-10-CM

## 2020-05-15 NOTE — Therapy (Signed)
[facility] [address] [place], [place], [address] Phone: [phone]   Fax:  [phone]  Physical Therapy Treatment  Patient Details  Name: Russell Harrington. MRN: [id number] Date of Birth: [birth year] Referring Provider (PT): Jari Sportsman, [place]   Encounter Date: 05/15/2020   PT End of Session - 05/15/20 0854    Visit Number 2    Number of Visits 16    Date for PT Re-Evaluation 06/26/20    PT Start Time 0848    PT Stop Time 0930    PT Time Calculation (min) 42 min           Past Medical History:  Diagnosis Date  . Asthma   . History of COVID-19 01/2019, 12/2019    Past Surgical History:  Procedure Laterality Date  . KNEE ARTHROSCOPY WITH MEDIAL MENISECTOMY Left 03/20/2020   Procedure: LEFT KNEE ARTHROSCOPY WITH debridement and synovectomy;  Surgeon: Tarry Kos, MD;  Location: [facility];  Service: Orthopedics;  Laterality: Left;  . NO PAST SURGERIES      There were no vitals filed for this visit.   Subjective Assessment - 05/15/20 0849    Subjective I think it is loosening up a little bit. I am doing the exercises. It still hurts all the time. Averages 5/10. Pain can be up to 8/10.    Currently in Pain? Yes    Pain Score 5     Pain Location Knee    Pain Orientation Left;Anterior;Upper    Pain Descriptors / Indicators Sore;Tightness;Aching    Pain Type Chronic pain;Surgical pain    Aggravating Factors  walking, laying down, sleeping    Pain Relieving Factors mice used to help, do not do that as much              [facility] PT Assessment - 05/15/20 0001      AROM   Left Knee Extension 35    Left Knee Flexion 104                         [facility] Adult PT Treatment/Exercise - 05/15/20 0001      Knee/Hip Exercises: Stretches   Active Hamstring Stretch 3 reps;30 seconds    Active Hamstring Stretch Limitations seated with strap- heel on floor    Gastroc Stretch 3 reps;10 seconds     Gastroc Stretch Limitations standing and sitting      Knee/Hip Exercises: Aerobic   Nustep L1 UE/LE x 5 minutes with cue for comfortable ROM.      Knee/Hip Exercises: Standing   Other Standing Knee Exercises mini squats at counter x 10      Knee/Hip Exercises: Seated   Long Arc Quad Limitations AAROM x 10      Knee/Hip Exercises: Supine   Short Arc Quad Sets 10 reps    Short Arc Quad Sets Limitations from tall bolster    Heel Slides 10 reps    Heel Slides Limitations increased pain                    PT Short Term Goals - 05/01/20 1222      PT SHORT TERM GOAL #1   Title Pt will be I and compliant with initial HEP.    Baseline provided at eval    Time 3    Period Weeks    Status New    Target Date 05/22/20      PT SHORT  TERM GOAL #2   Title Pt will improve AROM L knee extension to </= 30    Baseline 50    Time 3    Period Weeks    Status New    Target Date 05/22/20      PT SHORT TERM GOAL #3   Title Pt will increase AROM L knee flexion to >/= 110.    Baseline 100    Time 3    Period Weeks    Status New    Target Date 05/22/20             PT Long Term Goals - 05/01/20 1223      PT LONG TERM GOAL #1   Title Pt will improve L knee extension to at least 15 deficit or less for improved gait and quad firing.    Baseline antalgic gait, 50    Time 8    Period Weeks    Status New    Target Date 06/26/20      PT LONG TERM GOAL #2   Title Pt will improve L knee flexion to 120, as needed for transfers and ADLs.    Baseline 100    Time 8    Period Weeks    Status New    Target Date 06/26/20      PT LONG TERM GOAL #3   Title Pt will negotiate stairs with 0-1 HR safely and with reciprocal gait.    Baseline nonreciprocal, pain    Time 8    Period Weeks    Status New    Target Date 06/26/20      PT LONG TERM GOAL #4   Title Pt will increase L knee MMT to at least 4+/5 for improved knee stability.    Baseline ext 3-/5, flex 3+/5    Time  8    Period Weeks    Status New    Target Date 06/26/20      PT LONG TERM GOAL #5   Title Pt will be able to return to work and leisurely activity with minimal pain.    Baseline not working    Time 8    Period Weeks    Status New    Target Date 06/26/20                 Plan - 05/15/20 1036    Clinical Impression Statement Pt arrives with 5/10 pain and reports compliance with HEP. Began Nustep for ROM which he tolerated well. All HEP exercises increased his pain. He does show improvement in supine left Knee AROM since last measurement : 35-104 degrees left knee.    PT Next Visit Plan Re-evaluation nd continue at [facility] clinic which is very close to home - Assess response to HEP/update PRN, knee ROM via ther ex and manual as tolerated, gait, strengthening    PT Home Exercise Plan heel slides supine/sitting, knee ext with heel propped on chair, AAROM LAQ, active HS stretch           Patient will benefit from skilled therapeutic intervention in order to improve the following deficits and impairments:  Abnormal gait,Decreased balance,Decreased mobility,Difficulty walking,Pain,Postural dysfunction,Impaired flexibility,Increased fascial restricitons,Decreased strength,Improper body mechanics,Impaired perceived functional ability,Decreased activity tolerance,Decreased range of motion,Hypomobility,Decreased endurance  Visit Diagnosis: Chronic pain of left knee  Stiffness of left knee, not elsewhere classified  Muscle weakness (generalized)     Problem List Patient Active Problem List   Diagnosis Date Noted  . Decreased heart rate 03/24/2020  .  Synovitis of left knee   . Acute lateral meniscus tear of left knee 03/10/2020  . Acute torn meniscus of knee 02/11/2020  . Prediabetes 01/09/2020  . Encounter to establish care 12/19/2019  . Left knee pain 12/19/2019  . Cannabis use disorder, moderate, 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  . Sedative, hypnotic or anxiolytic use disorder,  severe, 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  . Opioid use disorder, moderate, dependence (HCC) 12/28/2017  . Severe recurrent major depression without psychotic features (HCC) 12/28/2017  . Suicidal ideation 12/28/2017  . Cocaine use disorder, moderate, dependence (HCC) 12/28/2017  . Tobacco use disorder 12/28/2017    Sherrie Mustache, PTA 05/15/2020, 10:41 AM  [facility] [address] [place], [place], [address] Phone: [phone]   Fax:  [phone]  Name: Ammar Moffatt. MRN: [id number] Date of Birth: Jun 23, 1975

## 2020-05-20 ENCOUNTER — Encounter: Payer: Self-pay | Admitting: Physical Therapy

## 2020-05-21 ENCOUNTER — Other Ambulatory Visit: Payer: Self-pay

## 2020-05-21 ENCOUNTER — Ambulatory Visit: Payer: Self-pay

## 2020-05-21 DIAGNOSIS — M6281 Muscle weakness (generalized): Secondary | ICD-10-CM

## 2020-05-21 DIAGNOSIS — M25662 Stiffness of left knee, not elsewhere classified: Secondary | ICD-10-CM

## 2020-05-21 DIAGNOSIS — G8929 Other chronic pain: Secondary | ICD-10-CM

## 2020-05-21 NOTE — Therapy (Addendum)
[place], [place], [address] Phone: [phone]   Fax:  [phone]  Physical Therapy Treatment/ Discharge Summary  Patient Details  Name: Russell Harrington. MRN: [id number] Date of Birth: Jan 22, 1976 Referring Provider (PT): Dwana Melena, [place]   Encounter Date: 05/21/2020   PT End of Session - 05/21/20 0939     Visit Number 3    Number of Visits 16    Date for PT Re-Evaluation 06/26/20    PT Start Time 0931    PT Stop Time 7681    PT Time Calculation (min) 52 min    Activity Tolerance Patient tolerated treatment well;Patient limited by pain    Behavior During Therapy [facility] for tasks assessed/performed             Past Medical History:  Diagnosis Date   Asthma    History of COVID-19 01/2019, 12/2019    Past Surgical History:  Procedure Laterality Date   KNEE ARTHROSCOPY WITH MEDIAL MENISECTOMY Left 03/20/2020   Procedure: LEFT KNEE ARTHROSCOPY WITH debridement and synovectomy;  Surgeon: Leandrew Koyanagi, MD;  Location: [place];  Service: Orthopedics;  Laterality: Left;   NO PAST SURGERIES      There were no vitals filed for this visit.   Subjective Assessment - 05/21/20 0936     Subjective "As far as the stretches and stuff, it feels like it's kinda doing a little better. Still hurts, a ot of pain and tightness. Other than that, it feels like it's giving me a little more flexibilty, but feels like the middle of the knee is in the way. Pain is really bad wen I try to sleep, or sit for awhile then try to get up".    Currently in Pain? Yes    Pain Score 5     Pain Location Knee    Pain Orientation Left;Anterior;Upper    Pain Descriptors / Indicators Sore;Tightness;Aching                               [facility] Adult PT Treatment/Exercise - 05/21/20 0001       Self-Care   Self-Care Heat/Ice Application    Heat/Ice Application Ice to use on knee when irritated,  after HEP or therapy, or increased activity level      Knee/Hip Exercises: Stretches   Active Hamstring Stretch 3 reps;30 seconds    Active Hamstring Stretch Limitations seated with strap- heel on floor    Gastroc Stretch 3 reps;10 seconds    Gastroc Stretch Limitations standing      Knee/Hip Exercises: Aerobic   Nustep L4 UE/LE x 5 min   attempted LE only for 1 min, then requested UE as well     Knee/Hip Exercises: Standing   Terminal Knee Extension 1 set;10 reps   tied to UBE seat and held for balance   Theraband Level (Terminal Knee Extension) Level 3 (Green)    Terminal Knee Extension Limitations 3-5"    Other Standing Knee Exercises mini squats at UBE with GTB behind 10x3-5"      Knee/Hip Exercises: Seated   Long Arc Quad Limitations AAROM x 10      Knee/Hip Exercises: Supine   Short Arc [employer] 10 reps    Short Arc Quad Sets Limitations small black bolster      Modalities   Modalities Cryotherapy      Cryotherapy   Number Minutes Cryotherapy  10 Minutes    Cryotherapy Location Knee   L   Type of Cryotherapy Ice pack                    PT Education - 05/21/20 1018     Education Details ICE    Person(s) Educated Patient    Methods Explanation    Comprehension Verbalized understanding              PT Short Term Goals - 05/01/20 1222       PT SHORT TERM GOAL #1   Title Pt will be I and compliant with initial HEP.    Baseline provided at eval    Time 3    Period Weeks    Status New    Target Date 05/22/20      PT SHORT TERM GOAL #2   Title Pt will improve AROM L knee extension to </= 30    Baseline 50    Time 3    Period Weeks    Status New    Target Date 05/22/20      PT SHORT TERM GOAL #3   Title Pt will increase AROM L knee flexion to >/= 110.    Baseline 100    Time 3    Period Weeks    Status New    Target Date 05/22/20               PT Long Term Goals - 05/01/20 1223       PT LONG TERM GOAL #1   Title Pt will  improve L knee extension to at least 15 deficit or less for improved gait and quad firing.    Baseline antalgic gait, 50    Time 8    Period Weeks    Status New    Target Date 06/26/20      PT LONG TERM GOAL #2   Title Pt will improve L knee flexion to 120, as needed for transfers and ADLs.    Baseline 100    Time 8    Period Weeks    Status New    Target Date 06/26/20      PT LONG TERM GOAL #3   Title Pt will negotiate stairs with 0-1 HR safely and with reciprocal gait.    Baseline nonreciprocal, pain    Time 8    Period Weeks    Status New    Target Date 06/26/20      PT LONG TERM GOAL #4   Title Pt will increase L knee MMT to at least 4+/5 for improved knee stability.    Baseline ext 3-/5, flex 3+/5    Time 8    Period Weeks    Status New    Target Date 06/26/20      PT LONG TERM GOAL #5   Title Pt will be able to return to work and leisurely activity with minimal pain.    Baseline not working    Time 8    Period Weeks    Status New    Target Date 06/26/20                   Plan - 05/21/20 0940     Clinical Impression Statement Pt continues to have high pain levels, but motivated to participate in all therapy today. Improvement in stiffness following NuStep today, and in standing squats to larger ROM with hip hinge and GTB behind L knee/above for incr  quad fire to improve extension. Ended session with ice - pt reporting relief.    PT Next Visit Plan Re-evaluation nd continue at [facility] clinic which is very close to home - Assess response to HEP/update PRN, knee ROM via ther ex and manual as tolerated, gait, strengthening    PT Home Exercise Plan heel slides supine/sitting, knee ext with heel propped on chair, AAROM LAQ, active HS stretch    Consulted and Agree with Plan of Care Patient             Patient will benefit from skilled therapeutic intervention in order to improve the following deficits and impairments:  Abnormal gait,Decreased  balance,Decreased mobility,Difficulty walking,Pain,Postural dysfunction,Impaired flexibility,Increased fascial restricitons,Decreased strength,Improper body mechanics,Impaired perceived functional ability,Decreased activity tolerance,Decreased range of motion,Hypomobility,Decreased endurance  Visit Diagnosis: Chronic pain of left knee  Stiffness of left knee, not elsewhere classified  Muscle weakness (generalized)     Problem List Patient Active Problem List   Diagnosis Date Noted   Decreased heart rate 03/24/2020   Synovitis of left knee    Acute lateral meniscus tear of left knee 03/10/2020   Acute torn meniscus of knee 02/11/2020   Prediabetes 01/09/2020   Encounter to establish care 12/19/2019   Left knee pain 12/19/2019   Cannabis use disorder, moderate, dependence ([place]) 12/29/2017   Sedative, hypnotic or anxiolytic use disorder, severe, dependence ([place]) 12/29/2017   Opioid use disorder, moderate, dependence ([place]) 12/28/2017   Severe recurrent major depression without psychotic features ([place]) 12/28/2017   Suicidal ideation 12/28/2017   Cocaine use disorder, moderate, dependence ([place]) 12/28/2017   Tobacco use disorder 12/28/2017    Izell [place], PT, DPT 05/21/2020, 10:19 AM  [facility] [address] [place], [place], [address] Phone: [phone]   Fax:  [phone]  Name: Shiraz Bastyr. MRN: [id number] Date of Birth: 02/08/1976  PHYSICAL THERAPY DISCHARGE SUMMARY  Visits from Start of Care: 3  Current functional level related to goals / functional outcomes: Unable to assess   Remaining deficits: Unable to assess   Education / Equipment: HEP   Patient agrees to discharge. Patient goals were not met. Patient is being discharged due to not returning since the last visit.  Vanessa Paoli, PT, DPT 03/02/21 6:36 PM

## 2020-05-26 ENCOUNTER — Other Ambulatory Visit: Payer: Self-pay

## 2020-05-26 ENCOUNTER — Ambulatory Visit: Payer: Self-pay | Admitting: Physical Therapy

## 2020-05-26 DIAGNOSIS — M25662 Stiffness of left knee, not elsewhere classified: Secondary | ICD-10-CM

## 2020-05-26 DIAGNOSIS — M25562 Pain in left knee: Secondary | ICD-10-CM

## 2020-05-26 DIAGNOSIS — G8929 Other chronic pain: Secondary | ICD-10-CM

## 2020-05-26 DIAGNOSIS — M6281 Muscle weakness (generalized): Secondary | ICD-10-CM

## 2020-05-26 NOTE — Therapy (Signed)
[name] [address] [name], [place], [address] Phone: [phone]   Fax:  [phone]  Physical Therapy Treatment  Patient Details  Name: Russell Harrington. MRN: [id number] Date of Birth: 11-May-1975 Referring Provider (PT): Jari Sportsman, [place]   Encounter Date: 05/26/2020   PT End of Session - 05/26/20 1331    Visit Number 4    Number of Visits 16    Date for PT Re-Evaluation 06/26/20    PT Start Time 1330    PT Stop Time 1415    PT Time Calculation (min) 45 min    Activity Tolerance Patient tolerated treatment well;Patient limited by pain    Behavior During Therapy [facility] for tasks assessed/performed           Past Medical History:  Diagnosis Date  . Asthma   . History of COVID-19 01/2019, 12/2019    Past Surgical History:  Procedure Laterality Date  . KNEE ARTHROSCOPY WITH MEDIAL MENISECTOMY Left 03/20/2020   Procedure: LEFT KNEE ARTHROSCOPY WITH debridement and synovectomy;  Surgeon: Tarry Kos, MD;  Location: [facility];  Service: Orthopedics;  Laterality: Left;  . NO PAST SURGERIES      There were no vitals filed for this visit.   Subjective Assessment - 05/26/20 1336    Subjective "I spoke with the clinic over in [place], and they told me to just keep coming to the clinic in [place] until I get my CAFA coverage.    Diagnostic tests MRI: partial lateral meniscus posterior horn tear    Patient Stated Goals to get knee straight and reduce pain    Currently in Pain? Yes    Pain Orientation Left              [facility] PT Assessment - 05/26/20 0001      Assessment   Medical Diagnosis L knee arthroscopy and partial lateral menisectomy    Referring Provider (PT) Jari Sportsman, PA-C      AROM   Left Knee Extension 34    Left Knee Flexion 106                         [facility] Adult PT Treatment/Exercise - 05/26/20 0001      Neuro Re-ed    Neuro Re-ed Details  forward/  backward rocking with lead leg DF 2 x 10 holding 2 seconds      Knee/Hip Exercises: Stretches   Active Hamstring Stretch 2 reps;30 seconds    Active Hamstring Stretch Limitations seated with strap- heel on floor      Knee/Hip Exercises: Supine   Short Arc Quad Sets 2 sets;10 reps      Manual Therapy   Manual Therapy Soft tissue mobilization    Manual therapy comments desensitization techniques    Soft tissue mobilization patellar mobs grade i-II for pain relief.                  PT Education - 05/26/20 1420    Education Details PNE using poster that pain doesnt' = damage and benefits of desensitization    Person(s) Educated Patient    Methods Explanation;Verbal cues;Handout    Comprehension Verbalized understanding;Verbal cues required            PT Short Term Goals - 05/01/20 1222      PT SHORT TERM GOAL #1   Title Pt will be I and compliant with initial HEP.    Baseline provided  at eval    Time 3    Period Weeks    Status New    Target Date 05/22/20      PT SHORT TERM GOAL #2   Title Pt will improve AROM L knee extension to </= 30    Baseline 50    Time 3    Period Weeks    Status New    Target Date 05/22/20      PT SHORT TERM GOAL #3   Title Pt will increase AROM L knee flexion to >/= 110.    Baseline 100    Time 3    Period Weeks    Status New    Target Date 05/22/20             PT Long Term Goals - 05/01/20 1223      PT LONG TERM GOAL #1   Title Pt will improve L knee extension to at least 15 deficit or less for improved gait and quad firing.    Baseline antalgic gait, 50    Time 8    Period Weeks    Status New    Target Date 06/26/20      PT LONG TERM GOAL #2   Title Pt will improve L knee flexion to 120, as needed for transfers and ADLs.    Baseline 100    Time 8    Period Weeks    Status New    Target Date 06/26/20      PT LONG TERM GOAL #3   Title Pt will negotiate stairs with 0-1 HR safely and with reciprocal gait.     Baseline nonreciprocal, pain    Time 8    Period Weeks    Status New    Target Date 06/26/20      PT LONG TERM GOAL #4   Title Pt will increase L knee MMT to at least 4+/5 for improved knee stability.    Baseline ext 3-/5, flex 3+/5    Time 8    Period Weeks    Status New    Target Date 06/26/20      PT LONG TERM GOAL #5   Title Pt will be able to return to work and leisurely activity with minimal pain.    Baseline not working    Time 8    Period Weeks    Status New    Target Date 06/26/20                 Plan - 05/26/20 1414    Clinical Impression Statement pt has previously planned to transfer over to [facility] but was provided information that he needed to stay in [place] until his CAFA was re-approved. worked on desensitization which he eventually responded well to but demonstratings high pain catastrophization and focused some of the session into PNE which he was receptive to. continued working on quad activation with both voluntary/ involuntary activation techniques. end of session he reported feeling better/ looser.    PT Treatment/Interventions ADLs/Self Care Home Management;Moist Heat;Iontophoresis 4mg /ml Dexamethasone;Electrical Stimulation;Cryotherapy;Joint Manipulations;Vasopneumatic Device;Passive range of motion;Dry needling;Taping;Scar mobilization;Energy conservation;Manual techniques;Patient/family education;Neuromuscular re-education;Balance training;Therapeutic exercise;Gait training;Stair training;Functional mobility training;Therapeutic activities    PT Next Visit Plan Re-evaluation nd continue at [facility] clinic which is very close to home (once CAFA is approved) - Assess response to HEP/update PRN, knee ROM via ther ex and manual as tolerated, gait, strengthening, desensitization    PT Home Exercise Plan heel slides supine/sitting, knee ext with heel propped  on chair, AAROM LAQ, active HS stretch    Consulted and Agree with Plan of Care Patient            Patient will benefit from skilled therapeutic intervention in order to improve the following deficits and impairments:  Abnormal gait,Decreased balance,Decreased mobility,Difficulty walking,Pain,Postural dysfunction,Impaired flexibility,Increased fascial restricitons,Decreased strength,Improper body mechanics,Impaired perceived functional ability,Decreased activity tolerance,Decreased range of motion,Hypomobility,Decreased endurance  Visit Diagnosis: Chronic pain of left knee  Stiffness of left knee, not elsewhere classified  Muscle weakness (generalized)     Problem List Patient Active Problem List   Diagnosis Date Noted  . Decreased heart rate 03/24/2020  . Synovitis of left knee   . Acute lateral meniscus tear of left knee 03/10/2020  . Acute torn meniscus of knee 02/11/2020  . Prediabetes 01/09/2020  . Encounter to establish care 12/19/2019  . Left knee pain 12/19/2019  . Cannabis use disorder, moderate, 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  . Sedative, hypnotic or anxiolytic use disorder, severe, 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  . Opioid use disorder, moderate, dependence (HCC) 12/28/2017  . Severe recurrent major depression without psychotic features (HCC) 12/28/2017  . Suicidal ideation 12/28/2017  . Cocaine use disorder, moderate, dependence (HCC) 12/28/2017  . Tobacco use disorder 12/28/2017    Lulu Riding PT, DPT, LAT, ATC  05/26/20  2:20 PM      [facility] [address] [place], [place], [address] Phone: [phone]   Fax:  [phone]  Name: Elic Vencill. MRN: [id number] Date of Birth: 1975/04/13

## 2020-05-27 ENCOUNTER — Telehealth: Payer: Self-pay | Admitting: Physical Therapy

## 2020-05-27 NOTE — Telephone Encounter (Signed)
LVM regarding schedule at sports rehab in [place]. That he would be able to get set up for physical therapy and they already scheduled him on 4/6 at 10:15 and if he cannot make that appointment his is able to call and I provided their number. Plan to continue being seen for the next 2 visits in [place] and plan to transfer to their clinic on 4/6. I also provided our clinic number if he has any questions.   Collin Rengel PT, DPT, LAT, ATC  05/27/20  4:17 PM

## 2020-05-28 ENCOUNTER — Ambulatory Visit: Payer: Self-pay

## 2020-05-28 ENCOUNTER — Other Ambulatory Visit: Payer: Self-pay

## 2020-05-28 DIAGNOSIS — G8929 Other chronic pain: Secondary | ICD-10-CM

## 2020-05-28 DIAGNOSIS — M25662 Stiffness of left knee, not elsewhere classified: Secondary | ICD-10-CM

## 2020-05-28 DIAGNOSIS — M6281 Muscle weakness (generalized): Secondary | ICD-10-CM

## 2020-05-28 DIAGNOSIS — M25562 Pain in left knee: Secondary | ICD-10-CM

## 2020-05-28 NOTE — Therapy (Signed)
[facility] [address] [place], [place], [address] Phone: [phone]   Fax:  [phone]  Physical Therapy Treatment  Patient Details  Name: Russell Harrington. MRN: [id number]  Date of Birth: 26-Jan-1976 Referring Provider (PT): Jari Sportsman, [place]   Encounter Date: 05/28/2020   PT End of Session - 05/28/20 0943    Visit Number 5    Number of Visits 16    Date for PT Re-Evaluation 06/26/20    PT Start Time 0930    PT Stop Time 1015    PT Time Calculation (min) 45 min    Activity Tolerance Patient tolerated treatment well;Patient limited by pain    Behavior During Therapy [facility] for tasks assessed/performed           Past Medical History:  Diagnosis Date  . Asthma   . History of COVID-19 01/2019, 12/2019    Past Surgical History:  Procedure Laterality Date  . KNEE ARTHROSCOPY WITH MEDIAL MENISECTOMY Left 03/20/2020   Procedure: LEFT KNEE ARTHROSCOPY WITH debridement and synovectomy;  Surgeon: Tarry Kos, MD;  Location: [facility];  Service: Orthopedics;  Laterality: Left;  . NO PAST SURGERIES      There were no vitals filed for this visit.   Subjective Assessment - 05/28/20 0939    Subjective Pt reports his knee feels improved motion-wise, but still the same pain level.    Diagnostic tests MRI: partial lateral meniscus posterior horn tear    Patient Stated Goals to get knee straight and reduce pain    Currently in Pain? Yes    Pain Score 5     Pain Location Knee    Pain Orientation Left    Pain Descriptors / Indicators Other (Comment)   aggravating, stinging             [facility] PT Assessment - 05/28/20 0001      AROM   Left Knee Extension 28   to 26 post knee flex/ext AROM with patellar inf/sup glides   Left Knee Flexion 102   to 108 with patellar mobs sup/inf with AROM knee flex/ext                        [facility] Adult PT Treatment/Exercise - 05/28/20 0001      Knee/Hip  Exercises: Standing   Terminal Knee Extension 1 set;15 reps    Terminal Knee Extension Limitations 7# free motion      Knee/Hip Exercises: Seated   Long Arc Quad Limitations AROM x 10      Knee/Hip Exercises: Supine   Short Arc Quad Sets 2 sets;10 reps    Short Arc Quad Sets Limitations + sup/inf patellar glides    Heel Slides 10 reps    Heel Slides Limitations with pillowcase around thigh      Manual Therapy   Manual Therapy Soft tissue mobilization    Manual therapy comments desensitization techniques    Soft tissue mobilization patellar mobs grade I-II for pain relief and increased mobility; superior/inferior mobs with knee extension/flexion                  PT Education - 05/28/20 1343    Education Details PNE, continuing to improve mobility to reduce pain    Person(s) Educated Patient    Methods Explanation    Comprehension Verbalized understanding            PT Short Term Goals - 05/28/20 1342  PT SHORT TERM GOAL #1   Title Pt will be I and compliant with initial HEP.    Time 3    Period Weeks    Status Achieved    Target Date 05/22/20      PT SHORT TERM GOAL #2   Title Pt will improve AROM L knee extension to </= 30    Baseline 26    Time 3    Period Weeks    Status Achieved    Target Date 05/22/20      PT SHORT TERM GOAL #3   Title Pt will increase AROM L knee flexion to >/= 110.    Baseline 108    Time 3    Period Weeks    Status On-going    Target Date 05/22/20             PT Long Term Goals - 05/28/20 1341      PT LONG TERM GOAL #1   Title Pt will improve L knee extension to at least 15 deficit or less for improved gait and quad firing.    Baseline antalgic gait, 50    Time 8    Period Weeks    Status New      PT LONG TERM GOAL #2   Title Pt will improve L knee flexion to 120, as needed for transfers and ADLs.    Baseline 108    Time 8    Period Weeks    Status On-going      PT LONG TERM GOAL #3   Title Pt  will negotiate stairs with 0-1 HR safely and with reciprocal gait.    Baseline NT    Time 8    Period Weeks    Status On-going      PT LONG TERM GOAL #4   Title Pt will increase L knee MMT to at least 4+/5 for improved knee stability.    Baseline NT    Time 8    Period Weeks    Status On-going      PT LONG TERM GOAL #5   Title Pt will be able to return to work and leisurely activity with minimal pain.    Baseline not working    Time 8    Period Weeks    Status On-going                 Plan - 05/28/20 0943    Clinical Impression Statement Pt will be transferring over to [facility] after today on 4/6, closer to home. He improved knee AROM 26-108 today post treatment with significant improvement in standing ability. He continues to have pain, but feels mobility improving. Increasing tolerance for desensitization and all ther ex today. He will continue to benefit from skilled interventions to address knee ROM deficits to improve functional mobility and gait.    PT Treatment/Interventions ADLs/Self Care Home Management;Moist Heat;Iontophoresis 4mg /ml Dexamethasone;Electrical Stimulation;Cryotherapy;Joint Manipulations;Vasopneumatic Device;Passive range of motion;Dry needling;Taping;Scar mobilization;Energy conservation;Manual techniques;Patient/family education;Neuromuscular re-education;Balance training;Therapeutic exercise;Gait training;Stair training;Functional mobility training;Therapeutic activities    PT Next Visit Plan Re-evaluation and continue at [facility] clinic which is very close to home (once CAFA is approved) - Assess response to HEP/update PRN, knee ROM via ther ex and manual as tolerated, gait, strengthening, desensitization    PT Home Exercise Plan heel slides supine/sitting, knee ext with heel propped on chair, AAROM LAQ, active HS stretch    Consulted and Agree with Plan of Care Patient  Patient will benefit from skilled therapeutic intervention in order to  improve the following deficits and impairments:  Abnormal gait,Decreased balance,Decreased mobility,Difficulty walking,Pain,Postural dysfunction,Impaired flexibility,Increased fascial restricitons,Decreased strength,Improper body mechanics,Impaired perceived functional ability,Decreased activity tolerance,Decreased range of motion,Hypomobility,Decreased endurance  Visit Diagnosis: Chronic pain of left knee  Stiffness of left knee, not elsewhere classified  Muscle weakness (generalized)     Problem List Patient Active Problem List   Diagnosis Date Noted  . Decreased heart rate 03/24/2020  . Synovitis of left knee   . Acute lateral meniscus tear of left knee 03/10/2020  . Acute torn meniscus of knee 02/11/2020  . Prediabetes 01/09/2020  . Encounter to establish care 12/19/2019  . Left knee pain 12/19/2019  . Cannabis use disorder, moderate, 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  . Sedative, hypnotic or anxiolytic use disorder, severe, 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  . Opioid use disorder, moderate, dependence (HCC) 12/28/2017  . Severe recurrent major depression without psychotic features (HCC) 12/28/2017  . Suicidal ideation 12/28/2017  . Cocaine use disorder, moderate, dependence (HCC) 12/28/2017  . Tobacco use disorder 12/28/2017    Marcelline Mates, PT, DPT 05/28/2020, 1:44 PM  [facility] [address] [place], [place], [address] Phone: [phone]   Fax:  [phone]  Name: Makel Mcmann. MRN: [id number] Date of Birth: Nov 19, 1975

## 2020-06-03 ENCOUNTER — Other Ambulatory Visit: Payer: Self-pay

## 2020-06-03 ENCOUNTER — Ambulatory Visit: Payer: Self-pay | Attending: Physician Assistant

## 2020-06-03 DIAGNOSIS — M25662 Stiffness of left knee, not elsewhere classified: Secondary | ICD-10-CM | POA: Insufficient documentation

## 2020-06-03 DIAGNOSIS — M6281 Muscle weakness (generalized): Secondary | ICD-10-CM | POA: Insufficient documentation

## 2020-06-03 DIAGNOSIS — G8929 Other chronic pain: Secondary | ICD-10-CM | POA: Insufficient documentation

## 2020-06-03 DIAGNOSIS — M25562 Pain in left knee: Secondary | ICD-10-CM | POA: Insufficient documentation
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
## 2020-06-04 ENCOUNTER — Ambulatory Visit: Payer: Self-pay | Admitting: Physical Therapy

## 2020-06-09 ENCOUNTER — Ambulatory Visit: Payer: Self-pay

## 2020-06-09 ENCOUNTER — Other Ambulatory Visit: Payer: Self-pay

## 2020-06-09 DIAGNOSIS — M25662 Stiffness of left knee, not elsewhere classified: Secondary | ICD-10-CM

## 2020-06-09 DIAGNOSIS — M6281 Muscle weakness (generalized): Secondary | ICD-10-CM

## 2020-06-09 DIAGNOSIS — G8929 Other chronic pain: Secondary | ICD-10-CM

## 2020-06-09 NOTE — Therapy (Signed)
[facility] [address] [place], [address] Phone: [phone]   Fax:  [phone]  Physical Therapy Treatment  Patient Details  Name: Russell Harrington. MRN: [id number] Date of Birth: 1975-10-31 Referring Provider (PT): Jari Sportsman, [place]   Encounter Date: 06/09/2020   PT End of Session - 06/09/20 1022    Visit Number 7    Number of Visits 16    Date for PT Re-Evaluation 06/26/20    PT Start Time 1022    PT Stop Time 1103    PT Time Calculation (min) 41 min    Activity Tolerance Patient tolerated treatment well;Patient limited by pain    Behavior During Therapy [facility] for tasks assessed/performed           Past Medical History:  Diagnosis Date  . Asthma   . History of COVID-19 01/2019, 12/2019    Past Surgical History:  Procedure Laterality Date  . KNEE ARTHROSCOPY WITH MEDIAL MENISECTOMY Left 03/20/2020   Procedure: LEFT KNEE ARTHROSCOPY WITH debridement and synovectomy;  Surgeon: Tarry Kos, MD;  Location: [facility];  Service: Orthopedics;  Laterality: Left;  . NO PAST SURGERIES      There were no vitals filed for this visit.   Subjective Assessment - 06/09/20 1028    Subjective Pt states L knee is feeling better after last session. Can move it better. Has been feeling good for the past 4 days. Pain is down to about a 2/10.    Diagnostic tests MRI: partial lateral meniscus posterior horn tear    Patient Stated Goals to get knee straight and reduce pain    Currently in Pain? Yes    Pain Score 2                                      PT Education - 06/09/20 1204    Education Details ther-ex, HEP    Person(s) Educated Patient    Methods Explanation;Demonstration;Verbal cues;Tactile cues;Handout    Comprehension Returned demonstration;Verbalized understanding           Objectives  Medbridge Access Code [id number]  Therapeutic exercise  Seated  B ankle DF/PF 10x3  Gait 150 ft   Standing heel raises with B UE assist 10x  L knee tightened up.   Seated glute max squeeze 10x5 seconds for 2 sets  Improved ability to bend knee and decreased discomfort observed  Reviewed and given as part of his HEP. Pt demonstrated and verbalized understanding. Handout provided.    Improved exercise technique, movement at target joints, use of target muscles after mod verbal, visual, tactile cues.     Manual therapy Seated STM L vastus lateralis Seated STM L medial knee  Still sensitive but not as much as last week   Electrical Stimulation   High Volt E-stim L knee channel 1: medial and lateral knee x 15 minutes at 200V.     Response to treatment Fair tolerance to today's session. Still irritable symptoms but not as much compared to previous session. Weight bearing exercises increased L knee medial and lateral joint tightness.     Clinical impression  Improved pain level compared to previous session. Continued utilizing E-stim for pain control to promote exercise participation. Still demonstrates TTP and sensitivity L knee. Increased symptoms with weight bearing as well. Continued working on gentle movement to help joint get used  motion. Fair tolerance to today's session. Pt will benefit from continued skilled physical therapy services to decrease pain, improve movement, strength and function.       PT Short Term Goals - 05/28/20 1342      PT SHORT TERM GOAL #1   Title Pt will be I and compliant with initial HEP.    Time 3    Period Weeks    Status Achieved    Target Date 05/22/20      PT SHORT TERM GOAL #2   Title Pt will improve AROM L knee extension to </= 30    Baseline 26    Time 3    Period Weeks    Status Achieved    Target Date 05/22/20      PT SHORT TERM GOAL #3   Title Pt will increase AROM L knee flexion to >/= 110.    Baseline 108    Time 3    Period Weeks    Status On-going    Target Date  05/22/20             PT Long Term Goals - 05/28/20 1341      PT LONG TERM GOAL #1   Title Pt will improve L knee extension to at least 15 deficit or less for improved gait and quad firing.    Baseline antalgic gait, 50    Time 8    Period Weeks    Status New      PT LONG TERM GOAL #2   Title Pt will improve L knee flexion to 120, as needed for transfers and ADLs.    Baseline 108    Time 8    Period Weeks    Status On-going      PT LONG TERM GOAL #3   Title Pt will negotiate stairs with 0-1 HR safely and with reciprocal gait.    Baseline NT    Time 8    Period Weeks    Status On-going      PT LONG TERM GOAL #4   Title Pt will increase L knee MMT to at least 4+/5 for improved knee stability.    Baseline NT    Time 8    Period Weeks    Status On-going      PT LONG TERM GOAL #5   Title Pt will be able to return to work and leisurely activity with minimal pain.    Baseline not working    Time 8    Period Weeks    Status On-going                 Plan - 06/09/20 1030    Clinical Impression Statement Improved pain level compared to previous session. Continued utilizing E-stim for pain control to promote exercise participation. Still demonstrates TTP and sensitivity L knee. Increased symptoms with weight bearing as well. Continued working on gentle movement to help joint get used motion. Fair tolerance to today's session. Pt will benefit from continued skilled physical therapy services to decrease pain, improve movement, strength and function.    PT Treatment/Interventions ADLs/Self Care Home Management;Moist Heat;Iontophoresis 4mg /ml Dexamethasone;Electrical Stimulation;Cryotherapy;Joint Manipulations;Vasopneumatic Device;Passive range of motion;Dry needling;Taping;Scar mobilization;Energy conservation;Manual techniques;Patient/family education;Neuromuscular re-education;Balance training;Therapeutic exercise;Gait training;Stair training;Functional mobility  training;Therapeutic activities    PT Next Visit Plan Re-evaluation and continue at [facility] clinic which is very close to home (once CAFA is approved) - Assess response to HEP/update PRN, knee ROM via ther ex and manual as tolerated, gait, strengthening,  desensitization    PT Home Exercise Plan heel slides supine/sitting, knee ext with heel propped on chair, AAROM LAQ, active HS stretch    Consulted and Agree with Plan of Care Patient           Patient will benefit from skilled therapeutic intervention in order to improve the following deficits and impairments:  Abnormal gait,Decreased balance,Decreased mobility,Difficulty walking,Pain,Postural dysfunction,Impaired flexibility,Increased fascial restricitons,Decreased strength,Improper body mechanics,Impaired perceived functional ability,Decreased activity tolerance,Decreased range of motion,Hypomobility,Decreased endurance  Visit Diagnosis: Chronic pain of left knee  Stiffness of left knee, not elsewhere classified  Muscle weakness (generalized)     Problem List Patient Active Problem List   Diagnosis Date Noted  . Decreased heart rate 03/24/2020  . Synovitis of left knee   . Acute lateral meniscus tear of left knee 03/10/2020  . Acute torn meniscus of knee 02/11/2020  . Prediabetes 01/09/2020  . Encounter to establish care 12/19/2019  . Left knee pain 12/19/2019  . Cannabis use disorder, moderate, 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  . Sedative, hypnotic or anxiolytic use disorder, severe, 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  . Opioid use disorder, moderate, dependence (HCC) 12/28/2017  . Severe recurrent major depression without psychotic features (HCC) 12/28/2017  . Suicidal ideation 12/28/2017  . Cocaine use disorder, moderate, dependence (HCC) 12/28/2017  . Tobacco use disorder 12/28/2017    Loralyn Freshwater PT, DPT   06/09/2020, 12:08 PM   [facility] PHYSICAL AND SPORTS MEDICINE [address] [place], [address] Phone: [phone]   Fax:  [phone]  Name: Khush Pasion. MRN: [id number] Date of Birth: 04-11-1975

## 2020-06-09 NOTE — Patient Instructions (Signed)
Access Code: [email or web address] URL: [email or web address] Date: 06/09/2020 Prepared by: Loralyn Freshwater  Exercises Seated Gluteal Sets - 5 x daily - 7 x weekly - 3 sets - 10 reps - 5 seconds hold

## 2020-06-11 ENCOUNTER — Ambulatory Visit: Payer: Self-pay

## 2020-06-11 ENCOUNTER — Other Ambulatory Visit: Payer: Self-pay

## 2020-06-11 DIAGNOSIS — M25562 Pain in left knee: Secondary | ICD-10-CM

## 2020-06-11 DIAGNOSIS — M6281 Muscle weakness (generalized): Secondary | ICD-10-CM

## 2020-06-11 DIAGNOSIS — M25662 Stiffness of left knee, not elsewhere classified: Secondary | ICD-10-CM

## 2020-06-11 DIAGNOSIS — G8929 Other chronic pain: Secondary | ICD-10-CM

## 2020-06-11 NOTE — Therapy (Signed)
[facility] [address], [place], [address] Phone: [phone]   Fax:  [phone]  Physical Therapy Treatment  Patient Details  Name: Russell Harrington. MRN: [id number] Date of Birth: 1975-05-24 Referring Provider (PT): Jari Sportsman, [place]   Encounter Date: 06/11/2020   PT End of Session - 06/11/20 1103    Visit Number 8    Number of Visits 16    Date for PT Re-Evaluation 06/26/20    PT Start Time 1103    PT Stop Time 1143    PT Time Calculation (min) 40 min    Activity Tolerance Patient tolerated treatment well;Patient limited by pain    Behavior During Therapy [facility] for tasks assessed/performed           Past Medical History:  Diagnosis Date  . Asthma   . History of COVID-19 01/2019, 12/2019    Past Surgical History:  Procedure Laterality Date  . KNEE ARTHROSCOPY WITH MEDIAL MENISECTOMY Left 03/20/2020   Procedure: LEFT KNEE ARTHROSCOPY WITH debridement and synovectomy;  Surgeon: Tarry Kos, MD;  Location: [facility];  Service: Orthopedics;  Laterality: Left;  . NO PAST SURGERIES      There were no vitals filed for this visit.   Subjective Assessment - 06/11/20 1104    Subjective L knee pain is not as constant as it was but his steps are still bothering him. 5/10 L knee pain currenntly. resting pain is 2/10, 5/10 when walking on it.    Diagnostic tests MRI: partial lateral meniscus posterior horn tear    Patient Stated Goals to get knee straight and reduce pain    Currently in Pain? Yes    Pain Score 5                                      PT Education - 06/11/20 1108    Education Details ther-ex    Person(s) Educated Patient    Methods Explanation;Demonstration;Tactile cues;Verbal cues    Comprehension Returned demonstration;Verbalized understanding           Objectives  Medbridge Access Code [id number]  Therapeutic exercise  Nustep seat  7, arms 7, level 1 for 10 minutes, cues for gentle movement to promote joint nutrition and pain control.  Cues for small and large movements and use of UE assist as needed for L knee ROM.   Time increased in small increments based on how L knee tolerates movements. Improved L knee ROM and comfort observed with increased UE assist.  Pt monitored by PT. Decreased L knee tightness reported.   Seated B ankle DF/PF 10x3  Seated glute max squeeze 10x5 seconds for 2 sets   Improved exercise technique, movement at target joints, use of target muscles after mod verbal, visual, tactile cues.   Electrical Stimulation   High Volt E-stim L knee channel 1: medial and lateral knee x 15 minutes at 210V.   [place] Stim L knee VMO channel 2 x 15 min at 18 mA   Response to treatment/Clinical impression.  Improved Nustep tolerance to 10 minutes with cues for technique (see above notes). Pt states L knee feels pretty good after exercise. Pt seems to be improving L knee comfort level, with gentle movement and use of modality based on subjective reports. Continued working on calming down L knee irritability with electrical stimulation and movement. Added [place]  setting to L VMO to promote better medial glide of L patella to hopefully continue to decrease L knee pain. Pt will benefit from continued skilled physical therapy services to decrease pain, improve strength and function.        PT Short Term Goals - 05/28/20 1342      PT SHORT TERM GOAL #1   Title Pt will be I and compliant with initial HEP.    Time 3    Period Weeks    Status Achieved    Target Date 05/22/20      PT SHORT TERM GOAL #2   Title Pt will improve AROM L knee extension to </= 30    Baseline 26    Time 3    Period Weeks    Status Achieved    Target Date 05/22/20      PT SHORT TERM GOAL #3   Title Pt will increase AROM L knee flexion to >/= 110.    Baseline 108    Time 3    Period Weeks    Status On-going    Target  Date 05/22/20             PT Long Term Goals - 05/28/20 1341      PT LONG TERM GOAL #1   Title Pt will improve L knee extension to at least 15 deficit or less for improved gait and quad firing.    Baseline antalgic gait, 50    Time 8    Period Weeks    Status New      PT LONG TERM GOAL #2   Title Pt will improve L knee flexion to 120, as needed for transfers and ADLs.    Baseline 108    Time 8    Period Weeks    Status On-going      PT LONG TERM GOAL #3   Title Pt will negotiate stairs with 0-1 HR safely and with reciprocal gait.    Baseline NT    Time 8    Period Weeks    Status On-going      PT LONG TERM GOAL #4   Title Pt will increase L knee MMT to at least 4+/5 for improved knee stability.    Baseline NT    Time 8    Period Weeks    Status On-going      PT LONG TERM GOAL #5   Title Pt will be able to return to work and leisurely activity with minimal pain.    Baseline not working    Time 8    Period Weeks    Status On-going                 Plan - 06/11/20 1108    Clinical Impression Statement Improved Nustep tolerance to 10 minutes with cues for technique (see above notes). Pt states L knee feels pretty good after exercise. Pt seems to be improving L knee comfort level, with gentle movement and use of modality based on subjective reports. Continued working on calming down L knee irritability with electrical stimulation and movement. Added [place] setting to L VMO to promote better medial glide of L patella to hopefully continue to decrease L knee pain. Pt will benefit from continued skilled physical therapy services to decrease pain, improve strength and function.    PT Treatment/Interventions ADLs/Self Care Home Management;Moist Heat;Iontophoresis 4mg /ml Dexamethasone;Electrical Stimulation;Cryotherapy;Joint Manipulations;Vasopneumatic Device;Passive range of motion;Dry needling;Taping;Scar mobilization;Energy conservation;Manual  techniques;Patient/family education;Neuromuscular re-education;Balance training;Therapeutic exercise;Gait training;Stair training;Functional  mobility training;Therapeutic activities    PT Next Visit Plan Re-evaluation and continue at [facility] clinic which is very close to home (once CAFA is approved) - Assess response to HEP/update PRN, knee ROM via ther ex and manual as tolerated, gait, strengthening, desensitization    PT Home Exercise Plan heel slides supine/sitting, knee ext with heel propped on chair, AAROM LAQ, active HS stretch    Consulted and Agree with Plan of Care Patient           Patient will benefit from skilled therapeutic intervention in order to improve the following deficits and impairments:  Abnormal gait,Decreased balance,Decreased mobility,Difficulty walking,Pain,Postural dysfunction,Impaired flexibility,Increased fascial restricitons,Decreased strength,Improper body mechanics,Impaired perceived functional ability,Decreased activity tolerance,Decreased range of motion,Hypomobility,Decreased endurance  Visit Diagnosis: Chronic pain of left knee  Stiffness of left knee, not elsewhere classified  Muscle weakness (generalized)     Problem List Patient Active Problem List   Diagnosis Date Noted  . Decreased heart rate 03/24/2020  . Synovitis of left knee   . Acute lateral meniscus tear of left knee 03/10/2020  . Acute torn meniscus of knee 02/11/2020  . Prediabetes 01/09/2020  . Encounter to establish care 12/19/2019  . Left knee pain 12/19/2019  . Cannabis use disorder, moderate, 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  . Sedative, hypnotic or anxiolytic use disorder, severe, 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  . Opioid use disorder, moderate, dependence (HCC) 12/28/2017  . Severe recurrent major depression without psychotic features (HCC) 12/28/2017  . Suicidal ideation 12/28/2017  . Cocaine use disorder, moderate, dependence (HCC) 12/28/2017  . Tobacco use disorder 12/28/2017     Loralyn Freshwater PT, DPT   06/11/2020, 11:44 AM  [facility] PHYSICAL AND SPORTS MEDICINE [address], [place], [address] Phone: [phone]   Fax:  [phone]  Name: Russell Harrington. MRN: [id number] Date of Birth: June 11, 1975

## 2020-06-16 ENCOUNTER — Other Ambulatory Visit: Payer: Self-pay

## 2020-06-16 ENCOUNTER — Telehealth: Payer: Self-pay

## 2020-06-16 ENCOUNTER — Ambulatory Visit: Payer: Self-pay

## 2020-06-16 ENCOUNTER — Encounter: Payer: Self-pay | Admitting: Physical Therapy

## 2020-06-16 DIAGNOSIS — M25662 Stiffness of left knee, not elsewhere classified: Secondary | ICD-10-CM

## 2020-06-16 DIAGNOSIS — M25562 Pain in left knee: Secondary | ICD-10-CM

## 2020-06-16 DIAGNOSIS — M6281 Muscle weakness (generalized): Secondary | ICD-10-CM

## 2020-06-16 DIAGNOSIS — G8929 Other chronic pain: Secondary | ICD-10-CM

## 2020-06-16 NOTE — Telephone Encounter (Signed)
No show. Called patient who said that he did not have a ride. Will try to make it to the 1:30 pm opening today. Pt was recommended to call back if unable to make it. Pt verbalized understanding.

## 2020-06-16 NOTE — Therapy (Signed)
[facility] [address] [place], [address] Phone: [phone]   Fax:  [phone]  Physical Therapy Treatment  Patient Details  Name: Russell Harrington. MRN: [id number] Date of Birth: 1976-01-27 Referring Provider (PT): Jari Sportsman, [place]   Encounter Date: 06/16/2020   PT End of Session - 06/16/20 1330    Visit Number 9    Number of Visits 16    Date for PT Re-Evaluation 06/26/20    PT Start Time 1330    PT Stop Time 1413    PT Time Calculation (min) 43 min    Activity Tolerance Patient tolerated treatment well;Patient limited by pain    Behavior During Therapy [facility] for tasks assessed/performed           Past Medical History:  Diagnosis Date  . Asthma   . History of COVID-19 01/2019, 12/2019    Past Surgical History:  Procedure Laterality Date  . KNEE ARTHROSCOPY WITH MEDIAL MENISECTOMY Left 03/20/2020   Procedure: LEFT KNEE ARTHROSCOPY WITH debridement and synovectomy;  Surgeon: Tarry Kos, MD;  Location: [facility];  Service: Orthopedics;  Laterality: Left;  . NO PAST SURGERIES      There were no vitals filed for this visit.   Subjective Assessment - 06/16/20 1331    Subjective L knee did good the rest of the day after last session but bothered him the next day. Feels better when he rests it.  [facility]. His knee has come a long way.    Diagnostic tests MRI: partial lateral meniscus posterior horn tear    Patient Stated Goals to get knee straight and reduce pain    Currently in Pain? Yes   No number provided.                                    PT Education - 06/16/20 1336    Education Details ther-ex    Person(s) Educated Patient    Methods Explanation;Demonstration;Tactile cues;Verbal cues    Comprehension Returned demonstration;Verbalized understanding           Objectives No follow up appointments with  surgeon per pt   MedbridgeAccess Code [email or web address]  Therapeutic exercise  Nustep seat 7, arms 7, level 1 for 5 minutes, cues for gentle movement to promote joint nutrition and pain control.  Cues for small and large movements and use of UE assist as needed for L knee ROM.           Pt monitored by PT. Decreased L knee tightness reported.    Seated glute max squeeze 10x5 seconds for 2 sets    Improved exercise technique, movement at target joints, use of target muscles after min to mod verbal, visual, tactile cues.  Manual therapy  Seated gentle STM L lateral knee to decrease fascial restrictions. Less irritable symptoms compared to previous sessions.   Jones technique to decrease muscle tension L vastus medialis   Electrical Stimulation  High Volt E-stim L knee channel 1: medial and lateral knee x at 170V for pain control     Response to treatment/Clinical impression.  Decreased NuStep time to 5 minutes. Still performed the exercise secondary to immediate positive results from last session. Held off [place] E-stim today to see if pt feels better the day after therapy. Less irritability of symptoms observed today with pt better  able to tolerate manual therapy to L knee. Pt states L knee feels better after session. Difficulty with L leg extension secondary to L medial knee discomfort (seems like a block in motion). Pt will benefit from continued skilled physical therapy services to decrease pain, improve strength and function.          PT Short Term Goals - 05/28/20 1342      PT SHORT TERM GOAL #1   Title Pt will be I and compliant with initial HEP.    Time 3    Period Weeks    Status Achieved    Target Date 05/22/20      PT SHORT TERM GOAL #2   Title Pt will improve AROM L knee extension to </= 30    Baseline 26    Time 3    Period Weeks    Status Achieved    Target Date 05/22/20      PT SHORT TERM GOAL #3   Title Pt will increase AROM L  knee flexion to >/= 110.    Baseline 108    Time 3    Period Weeks    Status On-going    Target Date 05/22/20             PT Long Term Goals - 05/28/20 1341      PT LONG TERM GOAL #1   Title Pt will improve L knee extension to at least 15 deficit or less for improved gait and quad firing.    Baseline antalgic gait, 50    Time 8    Period Weeks    Status New      PT LONG TERM GOAL #2   Title Pt will improve L knee flexion to 120, as needed for transfers and ADLs.    Baseline 108    Time 8    Period Weeks    Status On-going      PT LONG TERM GOAL #3   Title Pt will negotiate stairs with 0-1 HR safely and with reciprocal gait.    Baseline NT    Time 8    Period Weeks    Status On-going      PT LONG TERM GOAL #4   Title Pt will increase L knee MMT to at least 4+/5 for improved knee stability.    Baseline NT    Time 8    Period Weeks    Status On-going      PT LONG TERM GOAL #5   Title Pt will be able to return to work and leisurely activity with minimal pain.    Baseline not working    Time 8    Period Weeks    Status On-going                 Plan - 06/16/20 1326    Clinical Impression Statement Decreased NuStep time to 5 minutes. Still performed the exercise secondary to immediate positive results from last session. Held off [place] E-stim today to see if pt feels better the day after therapy. Less irritability of symptoms observed today with pt better able to tolerate manual therapy to L knee. Pt states L knee feels better after session. Difficulty with L leg extension secondary to L medial knee discomfort (seems like a block in motion). Pt will benefit from continued skilled physical therapy services to decrease pain, improve strength and function.    PT Treatment/Interventions ADLs/Self Care Home Management;Moist Heat;Iontophoresis 4mg /ml Dexamethasone;Electrical Stimulation;Cryotherapy;Joint Manipulations;Vasopneumatic Device;Passive range  of  motion;Dry needling;Taping;Scar mobilization;Energy conservation;Manual techniques;Patient/family education;Neuromuscular re-education;Balance training;Therapeutic exercise;Gait training;Stair training;Functional mobility training;Therapeutic activities    PT Next Visit Plan Re-evaluation and continue at [facility] clinic which is very close to home (once CAFA is approved) - Assess response to HEP/update PRN, knee ROM via ther ex and manual as tolerated, gait, strengthening, desensitization    PT Home Exercise Plan heel slides supine/sitting, knee ext with heel propped on chair, AAROM LAQ, active HS stretch    Consulted and Agree with Plan of Care Patient           Patient will benefit from skilled therapeutic intervention in order to improve the following deficits and impairments:  Abnormal gait,Decreased balance,Decreased mobility,Difficulty walking,Pain,Postural dysfunction,Impaired flexibility,Increased fascial restricitons,Decreased strength,Improper body mechanics,Impaired perceived functional ability,Decreased activity tolerance,Decreased range of motion,Hypomobility,Decreased endurance  Visit Diagnosis: Chronic pain of left knee  Stiffness of left knee, not elsewhere classified  Muscle weakness (generalized)     Problem List Patient Active Problem List   Diagnosis Date Noted  . Decreased heart rate 03/24/2020  . Synovitis of left knee   . Acute lateral meniscus tear of left knee 03/10/2020  . Acute torn meniscus of knee 02/11/2020  . Prediabetes 01/09/2020  . Encounter to establish care 12/19/2019  . Left knee pain 12/19/2019  . Cannabis use disorder, moderate, 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  . Sedative, hypnotic or anxiolytic use disorder, severe, 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  . Opioid use disorder, moderate, dependence (HCC) 12/28/2017  . Severe recurrent major depression without psychotic features (HCC) 12/28/2017  . Suicidal ideation 12/28/2017  . Cocaine use disorder, moderate,  dependence (HCC) 12/28/2017  . Tobacco use disorder 12/28/2017    Loralyn Freshwater PT, DPT   06/16/2020, 3:23 PM  [facility] PHYS[facility] AND SPORTS MEDICINE [address] [place], [address] Phone: [phone]   Fax:  [phone]  Name: Haidan Nhan. MRN: [id number] Date of Birth: 10-21-1975

## 2020-06-17 ENCOUNTER — Ambulatory Visit: Payer: Self-pay | Admitting: Adult Health

## 2020-06-17 ENCOUNTER — Other Ambulatory Visit: Payer: Self-pay

## 2020-06-17 ENCOUNTER — Encounter: Payer: Self-pay | Admitting: Adult Health

## 2020-06-17 DIAGNOSIS — G8929 Other chronic pain: Secondary | ICD-10-CM

## 2020-06-17 DIAGNOSIS — S83207A Unspecified tear of unspecified meniscus, current injury, left knee, initial encounter: Secondary | ICD-10-CM

## 2020-06-17 DIAGNOSIS — M659 Synovitis and tenosynovitis, unspecified: Secondary | ICD-10-CM

## 2020-06-17 DIAGNOSIS — M25562 Pain in left knee: Secondary | ICD-10-CM

## 2020-06-17 MED ORDER — LIDOCAINE 5 % EX PTCH
1.0000 | MEDICATED_PATCH | CUTANEOUS | 0 refills | Status: DC
  Filled 2020-06-17: qty 30, 30d supply, fill #0

## 2020-06-17 MED ORDER — IBUPROFEN 800 MG PO TABS
800.0000 mg | ORAL_TABLET | Freq: Three times a day (TID) | ORAL | 0 refills | Status: DC | PRN
  Filled 2020-06-17: qty 60, 20d supply, fill #0
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
## 2020-06-18 ENCOUNTER — Other Ambulatory Visit: Payer: Self-pay

## 2020-06-18 ENCOUNTER — Encounter: Payer: Self-pay | Admitting: Physical Therapy

## 2020-06-18 ENCOUNTER — Ambulatory Visit: Payer: Self-pay

## 2020-06-18 DIAGNOSIS — G8929 Other chronic pain: Secondary | ICD-10-CM

## 2020-06-18 DIAGNOSIS — M25662 Stiffness of left knee, not elsewhere classified: Secondary | ICD-10-CM

## 2020-06-18 DIAGNOSIS — M6281 Muscle weakness (generalized): Secondary | ICD-10-CM

## 2020-06-18 NOTE — Therapy (Signed)
[facility]TS MEDICINE [address], [place], [address] Phone: [phone]   Fax:  [phone]  Physical Therapy Treatment  Patient Details  Name: Russell Harrington. MRN: [id number] Date of Birth: 1975-10-27 Referring Provider (PT): Jari Sportsman, [place]   Encounter Date: 06/18/2020   PT End of Session - 06/18/20 0935    Visit Number 10    Number of Visits 16    Date for PT Re-Evaluation 06/26/20    PT Start Time 0936    PT Stop Time 1020    PT Time Calculation (min) 44 min    Activity Tolerance Patient tolerated treatment well;Patient limited by pain    Behavior During Therapy [facility] for tasks assessed/performed           Past Medical History:  Diagnosis Date  . Asthma   . History of COVID-19 01/2019, 12/2019    Past Surgical History:  Procedure Laterality Date  . KNEE ARTHROSCOPY WITH MEDIAL MENISECTOMY Left 03/20/2020   Procedure: LEFT KNEE ARTHROSCOPY WITH debridement and synovectomy;  Surgeon: Tarry Kos, MD;  Location: [facility];  Service: Orthopedics;  Laterality: Left;  . NO PAST SURGERIES      There were no vitals filed for this visit.   Subjective Assessment - 06/18/20 0937    Subjective [facility] Was told to call the surgeon to re-evaluate his L knee. 3/10 currently. more of a discomfort.  Pain has improved since starting PT here.    Diagnostic tests MRI: partial lateral meniscus posterior horn tear    Patient Stated Goals to get knee straight and reduce pain    Currently in Pain? Yes    Pain Score 3                                      PT Education - 06/18/20 1133    Education Details ther-ex    Person(s) Educated Patient    Methods Explanation;Demonstration;Tactile cues;Verbal cues    Comprehension Returned demonstration;Verbalized understanding           Objectives No follow up appointments with surgeon per  pt   MedbridgeAccess Code [email or web address]  Therapeutic exercise  Seated gentle L knee flexion isometrics PT manually resisted 10x5 seconds for 2 sets  Seated glute max squeeze 10x5 seconds for 2 sets  Decreased L knee pain.   Gait with emphasis on gentle knee flexion and extension L 60 ft to promote motion and decrease stiffness.     Improved exercise technique, movement at target joints, use of target muscles after min to mod verbal, visual, tactile cues.    Manual therapy  Seated gentle STM L anterior and superior knee to decrease fascial restrictions. Decreased overall irritability observed.     Electrical Stimulation  High Volt E-stim L knee channel 1: medial and lateral knee x at 215V for pain control     Response to treatment/Clinical impression Pt still demonstrates irritability of L knee pain but overall seems to be decreasing with the use of High Volt E-stim for pain control as well as with gentle glute max actrivation. Goals not assessed today secondary to primary goal at the moment is to calm down irritablity of symptoms. Pt tolerated session well without aggravation of symptoms. Pt will benefit from continued skilled physical therapy services to decrease pain, improve strength and function.  PT Short Term Goals - 05/28/20 1342      PT SHORT TERM GOAL #1   Title Pt will be I and compliant with initial HEP.    Time 3    Period Weeks    Status Achieved    Target Date 05/22/20      PT SHORT TERM GOAL #2   Title Pt will improve AROM L knee extension to </= 30    Baseline 26    Time 3    Period Weeks    Status Achieved    Target Date 05/22/20      PT SHORT TERM GOAL #3   Title Pt will increase AROM L knee flexion to >/= 110.    Baseline 108    Time 3    Period Weeks    Status On-going    Target Date 05/22/20             PT Long Term Goals - 05/28/20 1341      PT LONG TERM GOAL #1   Title Pt will improve L knee  extension to at least 15 deficit or less for improved gait and quad firing.    Baseline antalgic gait, 50    Time 8    Period Weeks    Status New      PT LONG TERM GOAL #2   Title Pt will improve L knee flexion to 120, as needed for transfers and ADLs.    Baseline 108    Time 8    Period Weeks    Status On-going      PT LONG TERM GOAL #3   Title Pt will negotiate stairs with 0-1 HR safely and with reciprocal gait.    Baseline NT    Time 8    Period Weeks    Status On-going      PT LONG TERM GOAL #4   Title Pt will increase L knee MMT to at least 4+/5 for improved knee stability.    Baseline NT    Time 8    Period Weeks    Status On-going      PT LONG TERM GOAL #5   Title Pt will be able to return to work and leisurely activity with minimal pain.    Baseline not working    Time 8    Period Weeks    Status On-going                 Plan - 06/18/20 0941    Clinical Impression Statement Pt still demonstrates irritability of L knee pain but overall seems to be decreasing with the use of High Volt E-stim for pain control as well as with gentle glute max actrivation. Goals not assessed today secondary to primary goal at the moment is to calm down irritablity of symptoms. Pt tolerated session well without aggravation of symptoms. Pt will benefit from continued skilled physical therapy services to decrease pain, improve strength and function.    PT Treatment/Interventions ADLs/Self Care Home Management;Moist Heat;Iontophoresis 4mg /ml Dexamethasone;Electrical Stimulation;Cryotherapy;Joint Manipulations;Vasopneumatic Device;Passive range of motion;Dry needling;Taping;Scar mobilization;Energy conservation;Manual techniques;Patient/family education;Neuromuscular re-education;Balance training;Therapeutic exercise;Gait training;Stair training;Functional mobility training;Therapeutic activities    PT Next Visit Plan Re-evaluation and continue at [facility] clinic which is very close to  home (once CAFA is approved) - Assess response to HEP/update PRN, knee ROM via ther ex and manual as tolerated, gait, strengthening, desensitization    PT Home Exercise Plan heel slides supine/sitting, knee ext with heel propped on chair, AAROM  LAQ, active HS stretch    Consulted and Agree with Plan of Care Patient           Patient will benefit from skilled therapeutic intervention in order to improve the following deficits and impairments:  Abnormal gait,Decreased balance,Decreased mobility,Difficulty walking,Pain,Postural dysfunction,Impaired flexibility,Increased fascial restricitons,Decreased strength,Improper body mechanics,Impaired perceived functional ability,Decreased activity tolerance,Decreased range of motion,Hypomobility,Decreased endurance  Visit Diagnosis: Chronic pain of left knee  Stiffness of left knee, not elsewhere classified  Muscle weakness (generalized)     Problem List Patient Active Problem List   Diagnosis Date Noted  . Decreased heart rate 03/24/2020  . Synovitis of left knee   . Acute lateral meniscus tear of left knee 03/10/2020  . Acute torn meniscus of knee 02/11/2020  . Prediabetes 01/09/2020  . Encounter to establish care 12/19/2019  . Left knee pain 12/19/2019  . Cannabis use disorder, moderate, 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  . Sedative, hypnotic or anxiolytic use disorder, severe, 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  . Opioid use disorder, moderate, dependence (HCC) 12/28/2017  . Severe recurrent major depression without psychotic features (HCC) 12/28/2017  . Suicidal ideation 12/28/2017  . Cocaine use disorder, moderate, dependence (HCC) 12/28/2017  . Tobacco use disorder 12/28/2017    Loralyn Freshwater PT, DPT   06/18/2020, 11:41 AM  [facility] PHYSICAL AND SPORTS MEDICINE [address] [place], [address] Phone: [phone]   Fax:  [phone]  Name: Russell Harrington. MRN: [id number] Date of Birth:  04/28/1975

## 2020-06-23 ENCOUNTER — Ambulatory Visit: Payer: Self-pay

## 2020-06-23 ENCOUNTER — Other Ambulatory Visit: Payer: Self-pay

## 2020-06-24 ENCOUNTER — Ambulatory Visit (INDEPENDENT_AMBULATORY_CARE_PROVIDER_SITE_OTHER): Payer: Self-pay | Admitting: Orthopaedic Surgery

## 2020-06-24 ENCOUNTER — Other Ambulatory Visit: Payer: Self-pay

## 2020-06-24 ENCOUNTER — Ambulatory Visit: Payer: Self-pay | Admitting: Adult Health

## 2020-06-24 ENCOUNTER — Encounter: Payer: Self-pay | Admitting: Orthopaedic Surgery

## 2020-06-24 DIAGNOSIS — M24562 Contracture, left knee: Secondary | ICD-10-CM

## 2020-06-24 NOTE — Progress Notes (Signed)
Office Visit Note   Patient: Russell Harrington.           Date of Birth: 03/21/1975           MRN: [id number] Visit Date: 06/24/2020              Requested by: No referring provider defined for this encounter. PCP: Patient, No Pcp Per (Inactive)   Assessment & Plan: Visit Diagnoses:  1. Flexion contracture of left knee     Plan: We again discussed treatment options that include continued physical therapy and aggressive home exercises to try to straighten the knee although I understand that this is a difficult task to accomplish.  We also talked about the option of releasing the posterior capsule of the knee in the operating theater to help regain extension so that he can ambulate better.  We talked about the potential complications to include but not limited to infection, neurovascular injury, incomplete resolution of flexion contracture, incomplete relief of pain.  He understands this and elects to proceed with surgical treatment in the near future.  Questions encouraged and answered.  Follow-Up Instructions: Return for Postop.   Orders:  No orders of the defined types were placed in this encounter.  No orders of the defined types were placed in this encounter.     Procedures: No procedures performed   Clinical Data: No additional findings.   Subjective: Chief Complaint  Patient presents with  . Left Knee - Pain    Russell Harrington returns today for follow-up of left knee pain.  He has been doing physical therapy to try to straighten out his knee.  He continues to have pain and numbness and stabbing pains and giving way walking.  He is limping and using swelling.  He is about 3 months status post left knee arthroscopy and partial lateral meniscectomy.  Intraoperative findings showed a healed lateral meniscal root tear.   Review of Systems  Constitutional: Negative.   All other systems reviewed and are negative.    Objective: Vital Signs: There were no vitals taken for this  visit.  Physical Exam Vitals and nursing note reviewed.  Constitutional:      Appearance: He is well-developed.  Pulmonary:     Effort: Pulmonary effort is normal.  Abdominal:     Palpations: Abdomen is soft.  Skin:    General: Skin is warm.  Neurological:     Mental Status: He is alert and oriented to person, place, and time.  Psychiatric:        Behavior: Behavior normal.        Thought Content: Thought content normal.        Judgment: Judgment normal.     Ortho Exam Left knee shows about a 20 degree flexion contracture with severe pain.  Flexion to 105 degrees with severe pain.  No joint effusion. Specialty Comments:  No specialty comments available.  Imaging: No results found.   PMFS History: Patient Active Problem List   Diagnosis Date Noted  . Flexion contracture of left knee 06/24/2020  . Decreased heart rate 03/24/2020  . Synovitis of left knee   . Acute lateral meniscus tear of left knee 03/10/2020  . Acute torn meniscus of knee 02/11/2020  . Prediabetes 01/09/2020  . Encounter to establish care 12/19/2019  . Left knee pain 12/19/2019  . Cannabis use disorder, moderate, 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  . Sedative, hypnotic or anxiolytic use disorder, severe, 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  . Opioid use disorder, moderate, dependence (  HCC) 12/28/2017  . Severe recurrent major depression without psychotic features (HCC) 12/28/2017  . Suicidal ideation 12/28/2017  . Cocaine use disorder, moderate, dependence (HCC) 12/28/2017  . Tobacco use disorder 12/28/2017   Past Medical History:  Diagnosis Date  . Asthma   . History of COVID-19 01/2019, 12/2019    History reviewed. No pertinent family history.  Past Surgical History:  Procedure Laterality Date  . KNEE ARTHROSCOPY WITH MEDIAL MENISECTOMY Left 03/20/2020   Procedure: LEFT KNEE ARTHROSCOPY WITH debridement and synovectomy;  Surgeon: Tarry Kos, MD;  Location: [facility];  Service:  Orthopedics;  Laterality: Left;  . NO PAST SURGERIES     Social History   Occupational History  . Not on file  Tobacco Use  . Smoking status: Current Every Day Smoker    Packs/day: 0.50    Types: Cigarettes  . Smokeless tobacco: Never Used  Vaping Use  . Vaping Use: Never used  Substance and Sexual Activity  . Alcohol use: Yes    Alcohol/week: 6.0 standard drinks    Types: 6 Cans of beer per week  . Drug use: Yes    Types: Marijuana  . Sexual activity: Not on file

## 2020-06-25 ENCOUNTER — Ambulatory Visit: Payer: Self-pay

## 2020-06-30 ENCOUNTER — Telehealth: Payer: Self-pay

## 2020-06-30 ENCOUNTER — Ambulatory Visit: Payer: Self-pay | Attending: Physician Assistant

## 2020-06-30 NOTE — Telephone Encounter (Signed)
No show. Called patient who said that he's getting surgery for his knee on 07/08/2020. Cancel his appointments and start back up a month later after surgery (per pt per MD) at 08/11/2020 to start back PT.

## 2020-07-01 ENCOUNTER — Encounter (HOSPITAL_BASED_OUTPATIENT_CLINIC_OR_DEPARTMENT_OTHER): Payer: Self-pay | Admitting: Orthopaedic Surgery

## 2020-07-02 ENCOUNTER — Ambulatory Visit: Payer: Self-pay

## 2020-07-06 ENCOUNTER — Other Ambulatory Visit (HOSPITAL_COMMUNITY)
Admission: RE | Admit: 2020-07-06 | Discharge: 2020-07-06 | Disposition: A | Discharge status: Home / Self Care | Payer: Self-pay | Source: Ambulatory Visit | Attending: Orthopaedic Surgery | Admitting: Orthopaedic Surgery

## 2020-07-06 DIAGNOSIS — Z01812 Encounter for preprocedural laboratory examination: Secondary | ICD-10-CM | POA: Insufficient documentation

## 2020-07-06 DIAGNOSIS — Z20822 Contact with and (suspected) exposure to covid-19: Secondary | ICD-10-CM | POA: Insufficient documentation

## 2020-07-07 ENCOUNTER — Ambulatory Visit: Payer: Self-pay

## 2020-07-07 ENCOUNTER — Encounter (HOSPITAL_BASED_OUTPATIENT_CLINIC_OR_DEPARTMENT_OTHER): Payer: Self-pay | Admitting: Orthopaedic Surgery

## 2020-07-07 LAB — SARS CORONAVIRUS 2 (TAT 6-24 HRS): SARS Coronavirus 2: NEGATIVE

## 2020-07-07 NOTE — Progress Notes (Signed)

## 2020-07-08 ENCOUNTER — Encounter (HOSPITAL_BASED_OUTPATIENT_CLINIC_OR_DEPARTMENT_OTHER)
Admission: RE | Disposition: A | Discharge status: Home / Self Care | Payer: Self-pay | Source: Home / Self Care | Attending: Orthopaedic Surgery

## 2020-07-08 ENCOUNTER — Encounter (HOSPITAL_BASED_OUTPATIENT_CLINIC_OR_DEPARTMENT_OTHER): Payer: Self-pay | Admitting: Orthopaedic Surgery

## 2020-07-08 ENCOUNTER — Ambulatory Visit (HOSPITAL_BASED_OUTPATIENT_CLINIC_OR_DEPARTMENT_OTHER): Payer: Self-pay | Admitting: Anesthesiology

## 2020-07-08 ENCOUNTER — Other Ambulatory Visit: Payer: Self-pay

## 2020-07-08 ENCOUNTER — Ambulatory Visit (HOSPITAL_BASED_OUTPATIENT_CLINIC_OR_DEPARTMENT_OTHER)
Admission: RE | Admit: 2020-07-08 | Discharge: 2020-07-08 | Disposition: A | Discharge status: Home / Self Care | Payer: Self-pay | Attending: Orthopaedic Surgery | Admitting: Orthopaedic Surgery

## 2020-07-08 DIAGNOSIS — Z8616 Personal history of COVID-19: Secondary | ICD-10-CM | POA: Insufficient documentation

## 2020-07-08 DIAGNOSIS — M24562 Contracture, left knee: Secondary | ICD-10-CM | POA: Insufficient documentation

## 2020-07-08 DIAGNOSIS — Z886 Allergy status to analgesic agent status: Secondary | ICD-10-CM | POA: Insufficient documentation

## 2020-07-08 DIAGNOSIS — F17210 Nicotine dependence, cigarettes, uncomplicated: Secondary | ICD-10-CM | POA: Insufficient documentation

## 2020-07-08 HISTORY — PX: KNEE CLOSED REDUCTION: SHX995

## 2020-07-08 HISTORY — DX: Opioid dependence, uncomplicated: F11.20

## 2020-07-08 HISTORY — PX: CAPSULAR RELEASE: SHX6293

## 2020-07-08 SURGERY — CAPSULAR RELEASE
Anesthesia: General | Site: Knee | Laterality: Left

## 2020-07-08 MED ORDER — FENTANYL CITRATE (PF) 100 MCG/2ML IJ SOLN
INTRAMUSCULAR | Status: AC
  Filled 2020-07-08: qty 2

## 2020-07-08 MED ORDER — DEXAMETHASONE SODIUM PHOSPHATE 10 MG/ML IJ SOLN
INTRAMUSCULAR | Status: DC | PRN
  Administered 2020-07-08: 5 mg via INTRAVENOUS

## 2020-07-08 MED ORDER — LACTATED RINGERS IV SOLN: INTRAVENOUS | Status: DC

## 2020-07-08 MED ORDER — BUPIVACAINE HCL (PF) 0.5 % IJ SOLN
INTRAMUSCULAR | Status: DC | PRN
  Administered 2020-07-08: 40 mL

## 2020-07-08 MED ORDER — BUPIVACAINE HCL (PF) 0.5 % IJ SOLN
INTRAMUSCULAR | Status: AC
  Filled 2020-07-08: qty 30

## 2020-07-08 MED ORDER — 0.9 % SODIUM CHLORIDE (POUR BTL) OPTIME
TOPICAL | Status: DC | PRN
  Administered 2020-07-08: 4000 mL

## 2020-07-08 MED ORDER — ONDANSETRON HCL 4 MG/2ML IJ SOLN
INTRAMUSCULAR | Status: DC | PRN
  Administered 2020-07-08: 4 mg via INTRAVENOUS

## 2020-07-08 MED ORDER — LIDOCAINE HCL (PF) 1 % IJ SOLN
INTRAMUSCULAR | Status: AC
  Filled 2020-07-08: qty 30

## 2020-07-08 MED ORDER — PROPOFOL 10 MG/ML IV BOLUS
INTRAVENOUS | Status: AC
  Filled 2020-07-08: qty 20

## 2020-07-08 MED ORDER — DEXMEDETOMIDINE (PRECEDEX) IN NS 20 MCG/5ML (4 MCG/ML) IV SYRINGE
PREFILLED_SYRINGE | INTRAVENOUS | Status: AC
  Filled 2020-07-08: qty 5

## 2020-07-08 MED ORDER — ACETAMINOPHEN 500 MG PO TABS
1000.0000 mg | ORAL_TABLET | Freq: Once | ORAL | Status: AC
  Administered 2020-07-08: 1000 mg via ORAL

## 2020-07-08 MED ORDER — CEFAZOLIN SODIUM-DEXTROSE 2-4 GM/100ML-% IV SOLN
INTRAVENOUS | Status: AC
  Filled 2020-07-08: qty 100

## 2020-07-08 MED ORDER — LIDOCAINE 2% (20 MG/ML) 5 ML SYRINGE
INTRAMUSCULAR | Status: DC | PRN
  Administered 2020-07-08: 80 mg via INTRAVENOUS

## 2020-07-08 MED ORDER — METHOCARBAMOL 750 MG PO TABS: 750.0000 mg | ORAL_TABLET | Freq: Two times a day (BID) | ORAL | 3 refills | Status: DC | PRN

## 2020-07-08 MED ORDER — CEFAZOLIN SODIUM-DEXTROSE 2-4 GM/100ML-% IV SOLN
2.0000 g | INTRAVENOUS | Status: AC
  Administered 2020-07-08: 2 g via INTRAVENOUS

## 2020-07-08 MED ORDER — MIDAZOLAM HCL 2 MG/2ML IJ SOLN
INTRAMUSCULAR | Status: AC
  Filled 2020-07-08: qty 2

## 2020-07-08 MED ORDER — PROPOFOL 10 MG/ML IV BOLUS
INTRAVENOUS | Status: DC | PRN
  Administered 2020-07-08: 200 mg via INTRAVENOUS

## 2020-07-08 MED ORDER — LIDOCAINE 2% (20 MG/ML) 5 ML SYRINGE
INTRAMUSCULAR | Status: AC
  Filled 2020-07-08: qty 5

## 2020-07-08 MED ORDER — PHENYLEPHRINE 40 MCG/ML (10ML) SYRINGE FOR IV PUSH (FOR BLOOD PRESSURE SUPPORT)
PREFILLED_SYRINGE | INTRAVENOUS | Status: DC | PRN
  Administered 2020-07-08 (×2): 200 ug via INTRAVENOUS

## 2020-07-08 MED ORDER — ACETAMINOPHEN 500 MG PO TABS
ORAL_TABLET | ORAL | Status: AC
  Filled 2020-07-08: qty 2

## 2020-07-08 MED ORDER — PHENYLEPHRINE 40 MCG/ML (10ML) SYRINGE FOR IV PUSH (FOR BLOOD PRESSURE SUPPORT)
PREFILLED_SYRINGE | INTRAVENOUS | Status: AC
  Filled 2020-07-08: qty 30

## 2020-07-08 MED ORDER — FENTANYL CITRATE (PF) 100 MCG/2ML IJ SOLN
25.0000 ug | INTRAMUSCULAR | Status: DC | PRN
  Administered 2020-07-08: 50 ug via INTRAVENOUS

## 2020-07-08 MED ORDER — MIDAZOLAM HCL 5 MG/5ML IJ SOLN
INTRAMUSCULAR | Status: DC | PRN
  Administered 2020-07-08: 2 mg via INTRAVENOUS

## 2020-07-08 MED ORDER — DEXMEDETOMIDINE (PRECEDEX) IN NS 20 MCG/5ML (4 MCG/ML) IV SYRINGE
PREFILLED_SYRINGE | INTRAVENOUS | Status: DC | PRN
  Administered 2020-07-08: 4 ug via INTRAVENOUS
  Administered 2020-07-08: 8 ug via INTRAVENOUS
  Administered 2020-07-08: 4 ug via INTRAVENOUS

## 2020-07-08 MED ORDER — GLYCOPYRROLATE PF 0.2 MG/ML IJ SOSY
PREFILLED_SYRINGE | INTRAMUSCULAR | Status: AC
  Filled 2020-07-08: qty 1

## 2020-07-08 MED ORDER — ONDANSETRON HCL 4 MG/2ML IJ SOLN
INTRAMUSCULAR | Status: AC
  Filled 2020-07-08: qty 2

## 2020-07-08 MED ORDER — EPHEDRINE 5 MG/ML INJ
INTRAVENOUS | Status: AC
  Filled 2020-07-08: qty 10

## 2020-07-08 MED ORDER — KETOROLAC TROMETHAMINE 30 MG/ML IJ SOLN
INTRAMUSCULAR | Status: AC
  Filled 2020-07-08: qty 2

## 2020-07-08 MED ORDER — FENTANYL CITRATE (PF) 100 MCG/2ML IJ SOLN
INTRAMUSCULAR | Status: DC | PRN
  Administered 2020-07-08 (×3): 50 ug via INTRAVENOUS
  Administered 2020-07-08: 100 ug via INTRAVENOUS
  Administered 2020-07-08: 50 ug via INTRAVENOUS

## 2020-07-08 MED ORDER — OXYCODONE-ACETAMINOPHEN 5-325 MG PO TABS: 1.0000 | ORAL_TABLET | Freq: Three times a day (TID) | ORAL | 0 refills | Status: DC | PRN

## 2020-07-08 MED ORDER — ROCURONIUM BROMIDE 10 MG/ML (PF) SYRINGE
PREFILLED_SYRINGE | INTRAVENOUS | Status: AC
  Filled 2020-07-08: qty 10

## 2020-07-08 SURGICAL SUPPLY — 69 items
BANDAGE ESMARK 6X9 LF (GAUZE/BANDAGES/DRESSINGS) ×1 IMPLANT
BLADE HEX COATED 2.75 (ELECTRODE) IMPLANT
BLADE SURG 15 STRL LF DISP TIS (BLADE) ×2 IMPLANT
BLADE SURG 15 STRL SS (BLADE) ×4
BNDG CMPR 9X6 STRL LF SNTH (GAUZE/BANDAGES/DRESSINGS) ×1
BNDG ELASTIC 4X5.8 VLCR STR LF (GAUZE/BANDAGES/DRESSINGS) IMPLANT
BNDG ELASTIC 6X5.8 VLCR STR LF (GAUZE/BANDAGES/DRESSINGS) ×2 IMPLANT
BNDG ESMARK 6X9 LF (GAUZE/BANDAGES/DRESSINGS) ×2
CLSR STERI-STRIP ANTIMIC 1/2X4 (GAUZE/BANDAGES/DRESSINGS) ×4 IMPLANT
COVER WAND RF STERILE (DRAPES) IMPLANT
CUFF TOURN SGL QUICK 24 (TOURNIQUET CUFF)
CUFF TOURN SGL QUICK 34 (TOURNIQUET CUFF) ×2
CUFF TRNQT CYL 24X4X16.5-23 (TOURNIQUET CUFF) IMPLANT
CUFF TRNQT CYL 34X4.125X (TOURNIQUET CUFF) ×1 IMPLANT
DECANTER SPIKE VIAL GLASS SM (MISCELLANEOUS) IMPLANT
DRAPE EXTREMITY T 121X128X90 (DISPOSABLE) ×2 IMPLANT
DRAPE INCISE 23X17 IOBAN STRL (DRAPES) ×2
DRAPE INCISE IOBAN 23X17 STRL (DRAPES) ×2 IMPLANT
DRAPE U-SHAPE 47X51 STRL (DRAPES) ×2 IMPLANT
DRSG TEGADERM 4X4.75 (GAUZE/BANDAGES/DRESSINGS) ×12 IMPLANT
DURAPREP 26ML APPLICATOR (WOUND CARE) ×4 IMPLANT
ELECT REM PT RETURN 9FT ADLT (ELECTROSURGICAL) ×2
ELECTRODE REM PT RTRN 9FT ADLT (ELECTROSURGICAL) ×1 IMPLANT
GAUZE SPONGE 4X4 12PLY STRL (GAUZE/BANDAGES/DRESSINGS) ×2 IMPLANT
GAUZE XEROFORM 1X8 LF (GAUZE/BANDAGES/DRESSINGS) IMPLANT
GLOVE SURG LTX SZ7 (GLOVE) ×2 IMPLANT
GLOVE SURG NEOP MICRO LF SZ7.5 (GLOVE) ×2 IMPLANT
GLOVE SURG SYN 7.5  E (GLOVE) ×2
GLOVE SURG SYN 7.5 E (GLOVE) ×2 IMPLANT
GLOVE SURG UNDER POLY LF SZ7 (GLOVE) ×2 IMPLANT
GOWN STRL REIN XL XLG (GOWN DISPOSABLE) ×2 IMPLANT
GOWN STRL REUS W/ TWL LRG LVL3 (GOWN DISPOSABLE) ×1 IMPLANT
GOWN STRL REUS W/ TWL XL LVL3 (GOWN DISPOSABLE) ×2 IMPLANT
GOWN STRL REUS W/TWL LRG LVL3 (GOWN DISPOSABLE) ×2
GOWN STRL REUS W/TWL XL LVL3 (GOWN DISPOSABLE) ×4
IMMOBILIZER KNEE 22 UNIV (SOFTGOODS) IMPLANT
IMMOBILIZER KNEE 24 THIGH 36 (MISCELLANEOUS) IMPLANT
IMMOBILIZER KNEE 24 UNIV (MISCELLANEOUS)
MANIFOLD NEPTUNE II (INSTRUMENTS) ×2 IMPLANT
NS IRRIG 1000ML POUR BTL (IV SOLUTION) ×6 IMPLANT
PACK ARTHROSCOPY DSU (CUSTOM PROCEDURE TRAY) ×4 IMPLANT
PACK BASIN DAY SURGERY FS (CUSTOM PROCEDURE TRAY) ×2 IMPLANT
PAD CAST 4YDX4 CTTN HI CHSV (CAST SUPPLIES) IMPLANT
PADDING CAST COTTON 4X4 STRL (CAST SUPPLIES)
PADDING CAST COTTON 6X4 STRL (CAST SUPPLIES) ×2 IMPLANT
PENCIL SMOKE EVACUATOR (MISCELLANEOUS) ×2 IMPLANT
PILLOW KNEE EXTENSION 0 DEG (MISCELLANEOUS) ×2 IMPLANT
RETRIEVER SUT HEWSON (MISCELLANEOUS) IMPLANT
SHEET MEDIUM DRAPE 40X70 STRL (DRAPES) ×2 IMPLANT
SLEEVE SCD COMPRESS KNEE MED (STOCKING) ×2 IMPLANT
SPONGE LAP 18X18 RF (DISPOSABLE) ×2 IMPLANT
STAPLER VISISTAT (STAPLE) IMPLANT
SUCTION FRAZIER HANDLE 10FR (MISCELLANEOUS)
SUCTION TUBE FRAZIER 10FR DISP (MISCELLANEOUS) IMPLANT
SUT ETHILON 3 0 PS 1 (SUTURE) IMPLANT
SUT FIBERWIRE #2 38 T-5 BLUE (SUTURE)
SUT MNCRL AB 3-0 PS2 18 (SUTURE) ×4 IMPLANT
SUT VIC AB 0 CT1 18XCR BRD 8 (SUTURE) IMPLANT
SUT VIC AB 0 CT1 8-18 (SUTURE)
SUT VIC AB 2-0 CT1 27 (SUTURE) ×8
SUT VIC AB 2-0 CT1 TAPERPNT 27 (SUTURE) ×4 IMPLANT
SUT VIC AB 2-0 SH 27 (SUTURE)
SUT VIC AB 2-0 SH 27XBRD (SUTURE) IMPLANT
SUTURE FIBERWR #2 38 T-5 BLUE (SUTURE) IMPLANT
SYR BULB IRRIG 60ML STRL (SYRINGE) ×2 IMPLANT
TOWEL GREEN STERILE FF (TOWEL DISPOSABLE) ×4 IMPLANT
TUBE CONNECTING 20X1/4 (TUBING) ×2 IMPLANT
UNDERPAD 30X36 HEAVY ABSORB (UNDERPADS AND DIAPERS) ×2 IMPLANT
YANKAUER SUCT BULB TIP NO VENT (SUCTIONS) ×2 IMPLANT

## 2020-07-08 NOTE — Anesthesia Preprocedure Evaluation (Addendum)
Anesthesia Evaluation  Patient identified by MRN, date of birth, ID band Patient awake    Reviewed: Allergy & Precautions, NPO status , Patient's Chart, lab work & pertinent test results  Airway Mallampati: III  TM Distance: >3 FB Neck ROM: Full    Dental no notable dental hx. (+) Poor Dentition, Dental Advisory Given, Missing,    Pulmonary asthma , Current Smoker and Patient abstained from smoking.,    Pulmonary exam normal breath sounds clear to auscultation       Cardiovascular negative cardio ROS Normal cardiovascular exam Rhythm:Regular Rate:Normal     Neuro/Psych PSYCHIATRIC DISORDERS Depression negative neurological ROS     GI/Hepatic negative GI ROS, (+)     substance abuse  cocaine use and marijuana use,   Endo/Other  negative endocrine ROS  Renal/GU negative Renal ROS  negative genitourinary   Musculoskeletal negative musculoskeletal ROS (+) narcotic dependent  Abdominal   Peds  Hematology negative hematology ROS (+)   Anesthesia Other Findings   Reproductive/Obstetrics                            Anesthesia Physical Anesthesia Plan  ASA: II  Anesthesia Plan: General   Post-op Pain Management:    Induction: Intravenous  PONV Risk Score and Plan: 1 and Ondansetron, Dexamethasone and Midazolam  Airway Management Planned: LMA  Additional Equipment:   Intra-op Plan:   Post-operative Plan: Extubation in OR  Informed Consent: I have reviewed the patients History and Physical, chart, labs and discussed the procedure including the risks, benefits and alternatives for the proposed anesthesia with the patient or authorized representative who has indicated his/her understanding and acceptance.     Dental advisory given  Plan Discussed with: CRNA  Anesthesia Plan Comments:         Anesthesia Quick Evaluation

## 2020-07-08 NOTE — Discharge Instructions (Signed)
Postoperative instructions:  Dressing instructions: Keep your dressing and/or splint clean and dry at all times.  It will be removed at your first post-operative appointment.  Your stitches and/or staples will be removed at this visit.  Incision instructions:  Do not soak your incision for 3 weeks after surgery.  If the incision gets wet, pat dry and do not scrub the incision.  Pain control:  You have been given a prescription to be taken as directed for post-operative pain control.  In addition, elevate the operative extremity above the heart at all times to prevent swelling and throbbing pain.  Take over-the-counter Colace, 100mg  by mouth twice a day while taking narcotic pain medications to help prevent constipation.  Follow up appointments: 1) 7 days for wound check. 2) Dr. as scheduled.   -------------------------------------------------------------------------------------------------------------  After Surgery Pain Control:  After your surgery, post-surgical discomfort or pain is likely. This discomfort can last several days to a few weeks. At certain times of the day your discomfort may be more intense.  Did you receive a nerve block?  A nerve block can provide pain relief for one hour to two days after your surgery. As long as the nerve block is working, you will experience little or no sensation in the area the surgeon operated on.  As the nerve block wears off, you will begin to experience pain or discomfort. It is very important that you begin taking your prescribed pain medication before the nerve block fully wears off. Treating your pain at the first sign of the block wearing off will ensure your pain is better controlled and more tolerable when full-sensation returns. Do not wait until the pain is intolerable, as the medicine will be less effective. It is better to treat pain in advance than to try and catch up.  General Anesthesia:  If you did not receive a nerve block  during your surgery, you will need to start taking your pain medication shortly after your surgery and should continue to do so as prescribed by your surgeon.  Pain Medication:  Most commonly we prescribe Vicodin and Percocet for post-operative pain. Both of these medications contain a combination of acetaminophen (Tylenol) and a narcotic to help control pain.   It takes between 30 and 45 minutes before pain medication starts to work. It is important to take your medication before your pain level gets too intense.   Nausea is a common side effect of many pain medications. You will want to eat something before taking your pain medicine to help prevent nausea.   If you are taking a prescription pain medication that contains acetaminophen, we recommend that you do not take additional over the counter acetaminophen (Tylenol).  Other pain relieving options:   Using a cold pack to ice the affected area a few times a day (15 to 20 minutes at a time) can help to relieve pain, reduce swelling and bruising.   Elevation of the affected area can also help to reduce pain and swelling.    1000mg  Tylenol given at 11 am on 07/08/20   Post Anesthesia Home Care Instructions  Activity: Get plenty of rest for the remainder of the day. A responsible individual must stay with you for 24 hours following the procedure.  For the next 24 hours, DO NOT: -Drive a car - -Drink alcoholic beverages -Take any medication unless instructed by your physician -Make any legal decisions or sign important papers.  Meals: Start with liquid foods such as  gelatin or soup. Progress to regular foods as tolerated. Avoid greasy, spicy, heavy foods. If nausea and/or vomiting occur, drink only clear liquids until the nausea and/or vomiting subsides. Call your physician if vomiting continues.  Special Instructions/Symptoms: Your throat may feel dry or sore from the anesthesia or the breathing tube placed in your  throat during surgery. If this causes discomfort, gargle with warm salt water. The discomfort should disappear within 24 hours.  If you had a scopolamine patch placed behind your ear for the management of post- operative nausea and/or vomiting:  1. The medication in the patch is effective for 72 hours, after which it should be removed.  Wrap patch in a tissue and discard in the trash. Wash hands thoroughly with soap and water. 2. You may remove the patch earlier than 72 hours if you experience unpleasant side effects which may include dry mouth, dizziness or visual disturbances. 3. Avoid touching the patch. Wash your hands with soap and water after contact with the patch.

## 2020-07-08 NOTE — H&P (Signed)
PREOPERATIVE H&P  Chief Complaint: LEFT KNEE FLEXION CONTRACTURE  HPI: Russell Harrington. is a 45 y.o. male who presents for surgical treatment of LEFT KNEE FLEXION CONTRACTURE.  He denies any changes in medical history.  Past Medical History:  Diagnosis Date  . Asthma   . History of COVID-19 01/2019, 12/2019  . Opioid dependence (HCC)    Past Surgical History:  Procedure Laterality Date  . KNEE ARTHROSCOPY WITH MEDIAL MENISECTOMY Left 03/20/2020   Procedure: LEFT KNEE ARTHROSCOPY WITH debridement and synovectomy;  Surgeon: Tarry Kos, MD;  Location: [facility];  Service: Orthopedics;  Laterality: Left;   Social History   Socioeconomic History  . Marital status: Single    Spouse name: Not on file  . Number of children: Not on file  . Years of education: Not on file  . Highest education level: Not on file  Occupational History  . Not on file  Tobacco Use  . Smoking status: Current Every Day Smoker    Packs/day: 0.50    Types: Cigarettes  . Smokeless tobacco: Never Used  Vaping Use  . Vaping Use: Never used  Substance and Sexual Activity  . Alcohol use: Yes    Alcohol/week: 6.0 standard drinks    Types: 6 Cans of beer per week    Comment: occasionally  . Drug use: Yes    Types: Marijuana  . Sexual activity: Not on file  Other Topics Concern  . Not on file  Social History Narrative  . Not on file   Social Determinants of Health   Financial Resource Strain: Not on file  Food Insecurity: Not on file  Transportation Needs: Not on file  Physical Activity: Not on file  Stress: Not on file  Social Connections: Not on file   History reviewed. No pertinent family history. Allergies  Allergen Reactions  . Aspirin Nausea And Vomiting   Prior to Admission medications   Medication Sig Start Date End Date Taking? Authorizing Provider  HYDROcodone-acetaminophen (NORCO) 5-325 MG tablet Take 1 tablet by mouth every 6 (six) hours as needed. 04/02/20    Cristie Hem, PA-C  ibuprofen (ADVIL) 800 MG tablet Take 1 tablet (800 mg total) by mouth every 8 (eight) hours as needed. 06/17/20   Tukov-Yual, Alroy Bailiff, NP  lidocaine (LIDODERM) 5 % Place 1 patch onto the skin daily. Remove and discard patch within 12 hours or as directed by MD. 06/17/20   Tukov-Yual, Alroy Bailiff, NP  methocarbamol (ROBAXIN) 500 MG tablet Take 1 tablet (500 mg total) by mouth 2 (two) times daily as needed. 04/28/20   Cristie Hem, PA-C  predniSONE (STERAPRED UNI-PAK 21 TAB) 10 MG (21) TBPK tablet Take as directed 04/28/20   Cristie Hem, PA-C     Positive ROS: All other systems have been reviewed and were otherwise negative with the exception of those mentioned in the HPI and as above.  Physical Exam: General: Alert, no acute distress Cardiovascular: No pedal edema Respiratory: No cyanosis, no use of accessory musculature GI: abdomen soft Skin: No lesions in the area of chief complaint Neurologic: Sensation intact distally Psychiatric: Patient is competent for consent with normal mood and affect Lymphatic: no lymphedema  MUSCULOSKELETAL: exam stable  Assessment: LEFT KNEE FLEXION CONTRACTURE  Plan: Plan for Procedure(s): left knee posterior capsular release (open), manipulation under anesthesia (1.5 hours needed)  The risks benefits and alternatives were discussed with the patient including but not limited to the risks of nonoperative treatment,  versus surgical intervention including infection, bleeding, nerve injury,  blood clots, cardiopulmonary complications, morbidity, mortality, among others, and they were willing to proceed.   Preoperative templating of the joint replacement has been completed, documented, and submitted to the Operating Room personnel in order to optimize intra-operative equipment management.   Glee Arvin, MD 07/08/2020 11:00 AM

## 2020-07-08 NOTE — Transfer of Care (Signed)
Immediate Anesthesia Transfer of Care Note  Patient: Russell Harrington.  Procedure(s) Performed: Left knee posterior capsular release (Left Knee) CLOSED MANIPULATION KNEE NDER ANESTHESIA (Left Knee)  Patient Location: PACU  Anesthesia Type:General  Level of Consciousness: drowsy and patient cooperative  Airway & Oxygen Therapy: Patient Spontanous Breathing and Patient connected to face mask oxygen  Post-op Assessment: Report given to RN and Post -op Vital signs reviewed and stable  Post vital signs: Reviewed and stable  Last Vitals:  Vitals Value Taken Time  BP 123/89 07/08/20 1357  Temp    Pulse 59 07/08/20 1400  Resp 14 07/08/20 1400  SpO2 98 % 07/08/20 1400  Vitals shown include unvalidated device data.  Last Pain:  Vitals:   07/08/20 1037  TempSrc: Oral  PainSc: 2          Complications: No complications documented.

## 2020-07-08 NOTE — Anesthesia Procedure Notes (Signed)
Procedure Name: LMA Insertion Date/Time: 07/08/2020 12:09 PM Performed by: Marny Lowenstein, CRNA Pre-anesthesia Checklist: Patient identified, Emergency Drugs available, Suction available and Patient being monitored Patient Re-evaluated:Patient Re-evaluated prior to induction Oxygen Delivery Method: Circle system utilized Preoxygenation: Pre-oxygenation with 100% oxygen Induction Type: IV induction Ventilation: Mask ventilation without difficulty LMA: LMA inserted LMA Size: 4.0 Number of attempts: 1 Placement Confirmation: positive ETCO2 and breath sounds checked- equal and bilateral Tube secured with: Tape Dental Injury: Teeth and Oropharynx as per pre-operative assessment

## 2020-07-08 NOTE — Op Note (Signed)
Date of Surgery: 07/08/2020  INDICATIONS: Mr. Bellanca is a 45 y.o.-year-old male with a left knee flexion contracture that failed conservative treatment.  The patient did consent to the procedure after discussion of the risks and benefits including but not limited to infection, reformulation of contraction, incomplete relief of pain, neurovascular injury.  PREOPERATIVE DIAGNOSIS: Flexion contracture of left knee joint  POSTOPERATIVE DIAGNOSIS: Same.  PROCEDURE:  1.  Posterior medial capsulotomy of left knee joint 2.  Posterior lateral capsulotomy of left knee joint through separate incision 3.  Manipulation of left knee joint under general anesthesia  SURGEON: N. Glee Arvin, M.D.  ASSIST: Starlyn Skeans [place], [place]; necessary for the timely completion of procedure and due to complexity of procedure.  ANESTHESIA:  general, local  IV FLUIDS AND URINE: See anesthesia.  ESTIMATED BLOOD LOSS: Minimal mL.  IMPLANTS: None  DRAINS: None  COMPLICATIONS: see description of procedure.  DESCRIPTION OF PROCEDURE: The patient was brought to the operating room.  The patient had been signed prior to the procedure and this was documented. The patient had the anesthesia placed by the anesthesiologist.  A time-out was performed to confirm that this was the correct patient, site, side and location.  We first performed a manipulation of the knee joint under anesthesia to improve flexion.  He had about 120 degrees of flexion which we were able to improve to about 130 degrees with the manipulation.  I was unable to improve his extension from the manipulation.  The patient did receive antibiotics prior to the incision and was re-dosed during the procedure as needed at indicated intervals.  A tourniquet was placed.  The patient had the operative extremity prepped and draped in the standard surgical fashion.    The knee was placed on a radiolucent triangle in a flexed position.  Landmarks were palpated and a  medial incision was made extending from the abductor tubercle down towards the insertion of the superficial medial collateral ligament.  Full-thickness flaps were raised anteriorly and posteriorly.  The sartorius fascia was encountered which was divided in the gracilis and semitendinosus tendons were then found deep to this layer.  The gracilis and semitendinosus tendons were retracted posteriorly and these superficial medial collateral ligament was identified.  We went between the interval of these 2 structures with blunt finger dissection onto the posterior medial capsule.  Blunt dissection was carried down through the pericapsular fat which was removed for visualization.  The medial head of the gastrocnemius was then elevated off of the posterior medial capsule using finger dissection.  Care was taken not to avulse the medial inferior genicular artery.  Once the capsule was identified retractors were placed anterior to the medial head of the gastroc and the capsulotomy was carefully performed with Bovie cautery.  I also carefully elevated the proximal portion of the posterior medial capsule off of the posterior distal femur and the condyles.  I was able to improve his knee extension by about 10 degrees.  Due to the residual flexion contracture I felt that the posterior lateral capsule needed to be released in order to achieve full extension.  I then made a separate incision over the lateral aspect of the knee extending from the lateral epicondyle down to Gertie's tubercle.  Dissection was carried down through the subcutaneous tissue and full-thickness flaps were elevated.  The interval between the IT band and the biceps femoris was developed.  I stayed posterior to the LCL.  Retractors were placed for protection and visualization.  I continued my blunt dissection down onto the posterior lateral capsule.  Again I used finger dissection to carefully and gently elevate the lateral head of the gastrocnemius off of  the capsule.  Care was taken not to injure the lateral inferior genicular artery.  With retractors in place I used Bovie cautery to perform the capsulotomy.  I partially released the insertion of the lateral head of the gastrocnemius.  Once this was done I was able to get his knee to full extension.  I was pleased with the results of the capsular release and the extension I was able to achieve.  The surgical wounds were then thoroughly irrigated with normal saline.  Tourniquet was deflated to check for bleeders.  Hemostasis was obtained.  Surgical wounds were closed in a layered fashion.  Sterile dressings were applied.  Patient tolerated the procedure well had no me complications.  Tessa Lerner was necessary for opening, closing, retracting, limb positioning and overall facilitation and timely completion of the procedure.  POSTOPERATIVE PLAN: Patient will be discharged home today.  He is weight-bear as tolerated with crutches.  We have provided him with a 0 degree bone foam to help prevent development of contracture.  We will also get him set up with a home CPM.  N. Glee Arvin, MD 1:29 PM

## 2020-07-08 NOTE — Anesthesia Postprocedure Evaluation (Signed)
Anesthesia Post Note  Patient: Russell Harrington.  Procedure(s) Performed: Left knee posterior capsular release (Left Knee) CLOSED MANIPULATION KNEE NDER ANESTHESIA (Left Knee)     Patient location during evaluation: PACU Anesthesia Type: General Level of consciousness: awake and alert Pain management: pain level controlled Vital Signs Assessment: post-procedure vital signs reviewed and stable Respiratory status: spontaneous breathing, nonlabored ventilation, respiratory function stable and patient connected to nasal cannula oxygen Cardiovascular status: blood pressure returned to baseline and stable Postop Assessment: no apparent nausea or vomiting Anesthetic complications: no   No complications documented.  Last Vitals:  Vitals:   07/08/20 1500 07/08/20 1530  BP: 130/87 (!) 118/94  Pulse: (!) 55 (!) 54  Resp: 11 12  Temp:  36.6 C  SpO2: 97% 98%    Last Pain:  Vitals:   07/08/20 1500  TempSrc:   PainSc: 7                  Ashyr Hedgepath L [name]

## 2020-07-09 ENCOUNTER — Other Ambulatory Visit: Payer: Self-pay

## 2020-07-09 ENCOUNTER — Encounter (HOSPITAL_BASED_OUTPATIENT_CLINIC_OR_DEPARTMENT_OTHER): Payer: Self-pay | Admitting: Orthopaedic Surgery

## 2020-07-15 ENCOUNTER — Ambulatory Visit (INDEPENDENT_AMBULATORY_CARE_PROVIDER_SITE_OTHER): Payer: Self-pay | Admitting: Physician Assistant

## 2020-07-15 ENCOUNTER — Other Ambulatory Visit: Payer: Self-pay

## 2020-07-15 ENCOUNTER — Encounter: Payer: Self-pay | Admitting: Orthopaedic Surgery

## 2020-07-15 DIAGNOSIS — M24562 Contracture, left knee: Secondary | ICD-10-CM

## 2020-07-15 MED ORDER — METHOCARBAMOL 750 MG PO TABS: 750.0000 mg | ORAL_TABLET | Freq: Two times a day (BID) | ORAL | 3 refills | Status: DC | PRN

## 2020-07-15 MED ORDER — OXYCODONE-ACETAMINOPHEN 5-325 MG PO TABS: 1.0000 | ORAL_TABLET | Freq: Three times a day (TID) | ORAL | 0 refills | Status: DC | PRN

## 2020-07-15 NOTE — Progress Notes (Signed)
Post-Op Visit Note   Patient: Russell Harrington.           Date of Birth: [birth year]           MRN: [id number] Visit Date: 07/15/2020 PCP: Patient, No Pcp Per (Inactive)   Assessment & Plan:  Chief Complaint:  Chief Complaint  Patient presents with  . Left Knee - Pain   Visit Diagnoses:  1. Flexion contracture of left knee     Plan: [name] who comes in today 1 week out left knee posterior medial and posterior lateral capsulotomy.  He has been in a fair amount of pain which is moderately relieved with narcotic pain medication.  He notes he has been using his bone foam is much as possible.  He has not been using his CPM as he does not have insurance and is unable to afford this.  He has not yet restarted physical therapy.  Examination of his left knee reveals fully healed surgical scars without complication.  Does have a moderate sized effusion.  Calf is soft nontender.  Range of motion 30 to 90 degrees.  He is neurovascular intact distally.  At this point, Steri-Strips were applied.  I have encouraged him to continue using his bone foam.  We will reinitiate physical therapy as he would like to continue at [facility] where he was prior to this most recent surgery.  I will put in an internal referral and he will reach back out to his physical therapist to schedule an appointment.  He will follow-up with [place] in 5 weeks time for recheck.  I will refill his pain medication in the meantime.  Call with concerns or questions in the meantime.  Follow-Up Instructions: Return in about 5 weeks (around 08/19/2020).   Orders:  No orders of the defined types were placed in this encounter.  No orders of the defined types were placed in this encounter.   Imaging: No new imaging  PMFS History: Patient Active Problem List   Diagnosis Date Noted  . Flexion contracture of left knee 06/24/2020  . Decreased heart rate 03/24/2020  . Synovitis of  left knee   . Acute lateral meniscus tear of left knee 03/10/2020  . Acute torn meniscus of knee 02/11/2020  . Prediabetes 01/09/2020  . Encounter to establish care 12/19/2019  . Left knee pain 12/19/2019  . Cannabis use disorder, moderate, 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  . Sedative, hypnotic or anxiolytic use disorder, severe, 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  . Opioid use disorder, moderate, dependence (HCC) 12/28/2017  . Severe recurrent major depression without psychotic features (HCC) 12/28/2017  . Suicidal ideation 12/28/2017  . Cocaine use disorder, moderate, dependence (HCC) 12/28/2017  . Tobacco use disorder 12/28/2017   Past Medical History:  Diagnosis Date  . Asthma   . History of COVID-19 01/2019, 12/2019  . Opioid dependence (HCC)     History reviewed. No pertinent family history.  Past Surgical History:  Procedure Laterality Date  . CAPSULAR RELEASE Left 07/08/2020   Procedure: Left knee posterior capsular release;  Surgeon: Tarry Kos, MD;  Location: [facility];  Service: Orthopedics;  Laterality: Left;  . KNEE ARTHROSCOPY WITH MEDIAL MENISECTOMY Left 03/20/2020   Procedure: LEFT KNEE ARTHROSCOPY WITH debridement and synovectomy;  Surgeon: Tarry Kos, MD;  Location: [facility];  Service: Orthopedics;  Laterality: Left;  . KNEE CLOSED REDUCTION Left 07/08/2020   Procedure: CLOSED MANIPULATION KNEE NDER ANESTHESIA;  Surgeon:  Tarry Kos, MD;  Location: [facility];  Service: Orthopedics;  Laterality: Left;   Social History   Occupational History  . Not on file  Tobacco Use  . Smoking status: Current Every Day Smoker    Packs/day: 0.50    Types: Cigarettes  . Smokeless tobacco: Never Used  Vaping Use  . Vaping Use: Never used  Substance and Sexual Activity  . Alcohol use: Yes    Alcohol/week: 6.0 standard drinks    Types: 6 Cans of beer per week    Comment: occasionally  . Drug use: Yes    Types: Marijuana  . Sexual  activity: Not on file

## 2020-07-29 ENCOUNTER — Other Ambulatory Visit: Payer: Self-pay

## 2020-07-29 ENCOUNTER — Encounter: Payer: Self-pay | Admitting: Gerontology

## 2020-07-29 ENCOUNTER — Ambulatory Visit: Payer: Self-pay | Admitting: Gerontology

## 2020-07-29 VITALS — BP 98/65 | HR 66 | Temp 97.0°F | Resp 18 | Ht 66.0 in | Wt 189.8 lb

## 2020-07-29 DIAGNOSIS — R7303 Prediabetes: Secondary | ICD-10-CM

## 2020-07-29 DIAGNOSIS — G8929 Other chronic pain: Secondary | ICD-10-CM

## 2020-07-29 DIAGNOSIS — M25562 Pain in left knee: Secondary | ICD-10-CM

## 2020-07-29 LAB — POCT GLYCOSYLATED HEMOGLOBIN (HGB A1C)
HbA1c POC (<> result, manual entry): 5.5 % (ref 4.0–5.6)
HbA1c, POC (controlled diabetic range): 5.5 % (ref 0.0–7.0)
HbA1c, POC (prediabetic range): 5.5 % — AB (ref 5.7–6.4)
Hemoglobin A1C: 5.5 % (ref 4.0–5.6)

## 2020-07-29 NOTE — Progress Notes (Signed)
Established Patient Office Visit  Subjective:  Patient ID: Russell Harrington., male    DOB: 1975/11/15  Age: 45 y.o. MRN: [id number]  CC:  Chief Complaint  Patient presents with  . Follow-up    Patient had left knee surgery on 07/08/20. Patient is scheduled to start PT on 08/11/20.    HPI Russell Harrington. is a 45 y/o male who has history of Asthma, Opioid dependence, presents for routine follow up. He had left posterior medial and posterior lateral capsulotomy to his left knee done on 07/08/20 by Dr Tarry Kos. He states that he's able to walk with his left foot on the ground. The surgical site is healed, no erythema nor tenderness with palpation.  He continues to expereince intermittent pain to left knee when he walks, and taking Robaxin 750 mg as needed relieves symptoms. He has Prediabetes and his HgbA1c checked during visit was 5.5%. Overall, he states that he's doing well and offers no further complaint.    Past Medical History:  Diagnosis Date  . Asthma   . History of COVID-19 01/2019, 12/2019  . Opioid dependence (HCC)     Past Surgical History:  Procedure Laterality Date  . CAPSULAR RELEASE Left 07/08/2020   Procedure: Left knee posterior capsular release;  Surgeon: Tarry Kos, MD;  Location: [facility];  Service: Orthopedics;  Laterality: Left;  . KNEE ARTHROSCOPY WITH MEDIAL MENISECTOMY Left 03/20/2020   Procedure: LEFT KNEE ARTHROSCOPY WITH debridement and synovectomy;  Surgeon: Tarry Kos, MD;  Location: [facility];  Service: Orthopedics;  Laterality: Left;  . KNEE CLOSED REDUCTION Left 07/08/2020   Procedure: CLOSED MANIPULATION KNEE NDER ANESTHESIA;  Surgeon: Tarry Kos, MD;  Location: [facility];  Service: Orthopedics;  Laterality: Left;    Family History  Problem Relation Age of Onset  . Healthy Mother   . Healthy Father   . Healthy Sister   . Healthy Sister     Social History   Socioeconomic History  . Marital  status: Single    Spouse name: Not on file  . Number of children: Not on file  . Years of education: Not on file  . Highest education level: Not on file  Occupational History  . Not on file  Tobacco Use  . Smoking status: Current Every Day Smoker    Packs/day: 0.50    Types: Cigarettes  . Smokeless tobacco: Never Used  Vaping Use  . Vaping Use: Never used  Substance and Sexual Activity  . Alcohol use: Yes    Alcohol/week: 6.0 standard drinks    Types: 6 Cans of beer per week    Comment: occasionally  . Drug use: Yes    Frequency: 7.0 times per week    Types: Marijuana    Comment: "a little bit every day"  . Sexual activity: Not on file  Other Topics Concern  . Not on file  Social History Narrative  . Not on file   Social Determinants of Health   Financial Resource Strain: Not on file  Food Insecurity: No Food Insecurity  . Worried About Programme researcher, broadcasting/film/video in the Last Year: Never true  . Ran Out of Food in the Last Year: Never true  Transportation Needs: No Transportation Needs  . Lack of Transportation (Medical): No  . Lack of Transportation (Non-Medical): No  Physical Activity: Not on file  Stress: Not on file  Social Connections: Not on file  Intimate Partner  Violence: Not on file    Outpatient Medications Prior to Visit  Medication Sig Dispense Refill  . methocarbamol (ROBAXIN) 750 MG tablet Take 1 tablet (750 mg total) by mouth 2 (two) times daily as needed for muscle spasms. 20 tablet 3  . HYDROcodone-acetaminophen (NORCO) 5-325 MG tablet Take 1 tablet by mouth every 6 (six) hours as needed. 20 tablet 0  . ibuprofen (ADVIL) 800 MG tablet Take 1 tablet (800 mg total) by mouth every 8 (eight) hours as needed. (Patient not taking: Reported on 07/29/2020) 60 tablet 0  . methocarbamol (ROBAXIN) 500 MG tablet Take 1 tablet (500 mg total) by mouth 2 (two) times daily as needed. 20 tablet 0  . oxyCODONE-acetaminophen (PERCOCET) 5-325 MG tablet Take 1-2 tablets by mouth  every 8 (eight) hours as needed for severe pain. 30 tablet 0   No facility-administered medications prior to visit.    Allergies  Allergen Reactions  . Aspirin Nausea And Vomiting    ROS Review of Systems  Constitutional: Negative.   Respiratory: Negative.   Musculoskeletal: Positive for arthralgias (left knee pain).  Skin: Negative.   Neurological: Negative.   Psychiatric/Behavioral: Negative.       Objective:    Physical Exam HENT:     Head: Normocephalic.  Eyes:     Extraocular Movements: Extraocular movements intact.     Conjunctiva/sclera: Conjunctivae normal.     Pupils: Pupils are equal, round, and reactive to light.  Cardiovascular:     Rate and Rhythm: Normal rate and regular rhythm.     Pulses: Normal pulses.     Heart sounds: Normal heart sounds.  Pulmonary:     Effort: Pulmonary effort is normal.     Breath sounds: Normal breath sounds.  Musculoskeletal:        General: No swelling, tenderness or deformity.  Skin:    General: Skin is warm.  Neurological:     General: No focal deficit present.     Mental Status: He is alert and oriented to person, place, and time. Mental status is at baseline.  Psychiatric:        Mood and Affect: Mood normal.        Behavior: Behavior normal.        Thought Content: Thought content normal.        Judgment: Judgment normal.     BP 98/65 (BP Location: Right Arm, Patient Position: Sitting, Cuff Size: Large)   Pulse 66   Temp (!) 97 F (36.1 C)   Resp 18   Ht 5\' 6"  (1.676 m)   Wt 189 lb 12.8 oz (86.1 kg)   SpO2 96%   BMI 30.63 kg/m  Wt Readings from Last 3 Encounters:  07/29/20 189 lb 12.8 oz (86.1 kg)  07/08/20 188 lb 0.8 oz (85.3 kg)  03/20/20 189 lb 13.1 oz (86.1 kg)   Weight loss encouraged  Health Maintenance Due  Topic Date Due  . COVID-19 Vaccine (1) Never done  . HIV Screening  Never done  . Hepatitis C Screening  Never done  . TETANUS/TDAP  Never done    There are no preventive care  reminders to display for this patient.  Lab Results  Component Value Date   TSH 2.360 01/02/2020   Lab Results  Component Value Date   WBC 12.5 (H) 01/02/2020   HGB 15.1 01/02/2020   HCT 45.6 01/02/2020   MCV 85 01/02/2020   PLT 228 01/02/2020   Lab Results  Component Value Date  NA 143 01/02/2020   K 4.6 01/02/2020   CO2 25 01/02/2020   GLUCOSE 94 01/02/2020   BUN 10 01/02/2020   CREATININE 1.17 01/02/2020   BILITOT 0.6 01/02/2020   ALKPHOS 49 01/02/2020   AST 14 01/02/2020   ALT 12 01/02/2020   PROT 5.9 (L) 01/02/2020   ALBUMIN 3.8 (L) 01/02/2020   CALCIUM 9.0 01/02/2020   ANIONGAP 9 12/29/2017   Lab Results  Component Value Date   CHOL 197 01/02/2020   Lab Results  Component Value Date   HDL 56 01/02/2020   Lab Results  Component Value Date   LDLCALC 91 01/02/2020   Lab Results  Component Value Date   TRIG 302 (H) 01/02/2020   Lab Results  Component Value Date   CHOLHDL 3.5 01/02/2020   Lab Results  Component Value Date   HGBA1C 5.5 07/29/2020   HGBA1C 5.5 07/29/2020   HGBA1C 5.5 (A) 07/29/2020   HGBA1C 5.5 07/29/2020      Assessment & Plan:    1. Prediabetes -His HgbA1 was 5.5%, he was encouraged to continue on low carb/non concentrated sweet diet and exercise as tolerated. - POCT HgB A1C  2. Chronic pain of left knee - He will continue with his PT, and follow up with Orthopedic Surgery in 5 weeks.    Follow-up: Return in about 3 months (around 10/29/2020), or if symptoms worsen or fail to improve.    Rami Waddle Trellis Paganini, NP

## 2020-08-11 ENCOUNTER — Ambulatory Visit: Payer: Self-pay

## 2020-08-19 ENCOUNTER — Encounter: Payer: Self-pay | Admitting: Orthopaedic Surgery

## 2020-10-29 ENCOUNTER — Other Ambulatory Visit: Payer: Self-pay

## 2020-10-29 ENCOUNTER — Ambulatory Visit: Payer: Self-pay | Admitting: Gerontology

## 2020-12-15 ENCOUNTER — Encounter: Payer: Self-pay | Admitting: Physician Assistant

## 2020-12-15 ENCOUNTER — Other Ambulatory Visit: Payer: Self-pay

## 2020-12-15 ENCOUNTER — Ambulatory Visit: Payer: Self-pay | Admitting: Physician Assistant

## 2020-12-15 DIAGNOSIS — Z113 Encounter for screening for infections with a predominantly sexual mode of transmission: Secondary | ICD-10-CM

## 2020-12-15 DIAGNOSIS — N341 Nonspecific urethritis: Secondary | ICD-10-CM

## 2020-12-15 LAB — GRAM STAIN

## 2020-12-15 LAB — HM HIV SCREENING LAB: HM HIV Screening: NEGATIVE

## 2020-12-15 MED ORDER — METRONIDAZOLE 500 MG PO TABS: 2000.0000 mg | ORAL_TABLET | Freq: Once | ORAL | 0 refills | Status: AC

## 2020-12-15 MED ORDER — DOXYCYCLINE HYCLATE 100 MG PO TABS: 100.0000 mg | ORAL_TABLET | Freq: Two times a day (BID) | ORAL | 0 refills | Status: AC

## 2020-12-15 NOTE — Progress Notes (Signed)
[facility] Department STI clinic/screening visit  Subjective:  Russell Harrington. is a 45 y.o. male being seen today for an STI screening visit. The patient reports they do have symptoms.    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  States that his partner is being treated for "a bacteria" but is not sure what infection she is being treated for.  Reports a history of depression but is not currently having any symptoms, taking any medicines or in care.  Reports history of left knee surgery.  States last HIV test was about 1 month ago and last void prior to sample collection for Gram stain was over 2 hr ago.  Screening for MPX risk: Does the patient have an unexplained rash? No Is the patient MSM? No Does the patient endorse multiple sex partners or anonymous sex partners? No Did the patient have close or sexual contact with a person diagnosed with MPX? No Has the patient traveled outside the [place] where MPX  is endemic? No Is there a high clinical suspicion for MPX-- evidenced by one of the following No  -Unlikely to be chickenpox  -Lymphadenopathy  -Rash that present in same phase of evolution on any given body part   See flowsheet for further details and programmatic requirements.    The following portions of the patient's history were reviewed and updated as appropriate: allergies, current medications, past medical history, past social history, past surgical history and problem list.  Objective:  There were no vitals filed for this visit.  Physical Exam Constitutional:      General: He is not in acute distress.    Appearance: Normal appearance.  HENT:     Head: Normocephalic and atraumatic.     Comments: No nits,lice, or hair loss. No cervical, supraclavicular or axillary adenopathy.     Mouth/Throat:     Mouth: Mucous membranes are moist.     Pharynx: Oropharynx is clear. No oropharyngeal exudate or posterior oropharyngeal erythema.  Eyes:     Conjunctiva/sclera: Conjunctivae normal.  Pulmonary:     Effort: Pulmonary effort is normal.  Abdominal:     Palpations: Abdomen is soft. There is no mass.     Tenderness: There is no abdominal tenderness. There is no guarding or rebound.  Genitourinary:    Penis: Normal.      Testes: Normal.     Comments: Pubic area without nits, lice, hair loss, edema, erythema, lesions and inguinal adenopathy. Penis uncircumcised without rash, lesions and discharge at  meatus. Testicles descended bilaterally,nt, no masses or edema.  Musculoskeletal:     Cervical back: Neck supple. No tenderness.  Skin:    General: Skin is warm and dry.     Findings: No bruising, erythema, lesion or rash.  Neurological:     Mental Status: He is alert and oriented to person, place, and time.  Psychiatric:        Mood and Affect: Mood normal.        Behavior: Behavior normal.        Thought Content: Thought content normal.        Judgment: Judgment normal.       Assessment and Plan:  Russell Near. is a 45 y.o. male presenting to the [facility] Department for STI screening  1. Screening for STD (sexually transmitted disease) Patient into clinic with symptoms. Reviewed with patient Gram stain results. Rec condoms with all sex. Await test results.  Counseled that RN will call if needs to RTC for treatment once results are back.  - Gram stain - Gonococcus culture - HIV [facility] - Syphilis Serology, [facility] - Gonococcus culture  2. NGU (nongonococcal urethritis) Will treat to cover for likely causes of NGU due to symptoms and that partner is currently being treated. Treat NGU with Doxycycline 100 mg #14 1 po BID for 7 days and Metronidazole 2 g po stat with food, no EtOH for 24 hr before and until 72 hr after completing medicine.  No sex for 14 days and until after partner completes treatment. Call with questions or concerns.  - doxycycline (VIBRA-TABS) 100 MG tablet; Take 1 tablet (100 mg total) by mouth 2 (two) times daily for 7 days.  Dispense: 14 tablet; Refill: 0 - metroNIDAZOLE (FLAGYL) 500 MG tablet; Take 4 tablets (2,000 mg total) by mouth once for 1 dose.  Dispense: 4 tablet; Refill: 0     No follow-ups on file.  No future appointments.  Matt Holmes, PA

## 2020-12-19 LAB — GONOCOCCUS CULTURE

## 2020-12-20 NOTE — Progress Notes (Signed)
Chart reviewed by Pharmacist  Suzanne Walker PharmD, Contract Pharmacist at [facility]  

## 2021-03-29 ENCOUNTER — Other Ambulatory Visit: Payer: Self-pay

## 2021-03-29 ENCOUNTER — Ambulatory Visit
Admission: EM | Admit: 2021-03-29 | Discharge: 2021-03-29 | Disposition: A | Discharge status: Home / Self Care | Payer: Self-pay | Attending: Internal Medicine | Admitting: Internal Medicine

## 2021-03-29 ENCOUNTER — Encounter: Payer: Self-pay | Admitting: Emergency Medicine

## 2021-03-29 DIAGNOSIS — M7912 Myalgia of auxiliary muscles, head and neck: Secondary | ICD-10-CM

## 2021-03-29 DIAGNOSIS — M436 Torticollis: Secondary | ICD-10-CM

## 2021-03-29 DIAGNOSIS — M5412 Radiculopathy, cervical region: Secondary | ICD-10-CM

## 2021-03-29 MED ORDER — CYCLOBENZAPRINE HCL 10 MG PO TABS: 10.0000 mg | ORAL_TABLET | Freq: Two times a day (BID) | ORAL | 0 refills | Status: AC | PRN

## 2021-03-29 MED ORDER — KETOROLAC TROMETHAMINE 10 MG PO TABS: 10.0000 mg | ORAL_TABLET | Freq: Four times a day (QID) | ORAL | 0 refills | Status: AC | PRN

## 2021-03-29 MED ORDER — KETOROLAC TROMETHAMINE 60 MG/2ML IM SOLN
60.0000 mg | Freq: Once | INTRAMUSCULAR | Status: AC
  Administered 2021-03-29: 60 mg via INTRAMUSCULAR

## 2021-03-29 NOTE — ED Provider Notes (Signed)
[facility]    CSN: [id number] Arrival date & time: 03/29/21  1225      History   Chief Complaint Chief Complaint  Patient presents with   Neck Pain   Shoulder Pain    right    HPI Russell Harrington. is a 46 y.o. male who presents with R side neck and shoulder pain x 1 month. Has been keeping him up at night. The pain does not radiate to his arm to hid forearm. Feels intermittent numbness on R trap area. Has taken Tylenol and Ibuprofen for pain but does not help. His pain gets worse with arm hanging or up on the steering wheel when he drives.     Past Medical History:  Diagnosis Date   Asthma    History of COVID-19 01/2019, 12/2019   Opioid dependence [facility])     Patient Active Problem List   Diagnosis Date Noted   Flexion contracture of left knee 06/24/2020   Decreased heart rate 03/24/2020   Synovitis of left knee    Acute lateral meniscus tear of left knee 03/10/2020   Acute torn meniscus of knee 02/11/2020   Prediabetes 01/09/2020   Encounter to establish care 12/19/2019   Left knee pain 12/19/2019   Cannabis use disorder, moderate, 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   Sedative, hypnotic or anxiolytic use disorder, severe, 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   Opioid use disorder, moderate, dependence (HCC) 12/28/2017   Severe recurrent major depression without psychotic features (HCC) 12/28/2017   Suicidal ideation 12/28/2017   Cocaine use disorder, moderate, dependence (HCC) 12/28/2017   Tobacco use disorder 12/28/2017    Past Surgical History:  Procedure Laterality Date   CAPSULAR RELEASE Left 07/08/2020   Procedure: Left knee posterior capsular release;  Surgeon: Tarry Kos, MD;  Location: [facility];  Service: Orthopedics;  Laterality: Left;   KNEE ARTHROSCOPY WITH MEDIAL MENISECTOMY Left 03/20/2020   Procedure: LEFT KNEE ARTHROSCOPY WITH debridement and synovectomy;  Surgeon: Tarry Kos, MD;  Location: [facility];  Service:  Orthopedics;  Laterality: Left;   KNEE CLOSED REDUCTION Left 07/08/2020   Procedure: CLOSED MANIPULATION KNEE NDER ANESTHESIA;  Surgeon: Tarry Kos, MD;  Location:  SURGERY CENTER;  Service: Orthopedics;  Laterality: Left;       Home Medications    Prior to Admission medications   Medication Sig Start Date End Date Taking? Authorizing Provider  cyclobenzaprine (FLEXERIL) 10 MG tablet Take 1 tablet (10 mg total) by mouth 2 (two) times daily as needed for muscle spasms. 03/29/21  Yes Rodriguez-Southworth, Nettie Elm, PA-C  ketorolac (TORADOL) 10 MG tablet Take 1 tablet (10 mg total) by mouth every 6 (six) hours as needed. 03/30/21  Yes Rodriguez-Southworth, Nettie Elm, PA-C    Family History Family History  Problem Relation Age of Onset   Healthy Mother    Healthy Father    Healthy Sister    Healthy Sister     Social History Social History   Tobacco Use   Smoking status: Every Day    Packs/day: 0.50    Types: Cigarettes   Smokeless tobacco: Never  Vaping Use   Vaping Use: Never used  Substance Use Topics   Alcohol use: Yes    Alcohol/week: 6.0 standard drinks    Types: 6 Cans of beer per week    Comment: occasionally   Drug use: Not Currently    Frequency: 7.0 times per week    Types: Marijuana, Cocaine    Comment: "a little  bit every day"     Allergies   Aspirin   Review of Systems Review of Systems  Constitutional:  Negative for fever.  Musculoskeletal:  Positive for neck pain and neck stiffness.  Neurological:  Positive for numbness. Negative for weakness.    Physical Exam Triage Vital Signs ED Triage Vitals  Enc Vitals Group     BP 03/29/21 1304 124/74     Pulse Rate 03/29/21 1304 65     Resp 03/29/21 1304 18     Temp 03/29/21 1304 98.3 F (36.8 C)     Temp Source 03/29/21 1304 Oral     SpO2 03/29/21 1304 99 %     Weight 03/29/21 1302 189 lb 13.1 oz (86.1 kg)     Height 03/29/21 1302 5\' 6"  (1.676 m)     Head Circumference --      Peak Flow  --      Pain Score 03/29/21 1301 8     Pain Loc --      Pain Edu? --      Excl. in GC? --    No data found.  Updated Vital Signs BP 124/74 (BP Location: Left Arm)    Pulse 65    Temp 98.3 F (36.8 C) (Oral)    Resp 18    Ht 5\' 6"  (1.676 m)    Wt 189 lb 13.1 oz (86.1 kg)    SpO2 99%    BMI 30.64 kg/m   Visual Acuity Right Eye Distance:   Left Eye Distance:   Bilateral Distance:    Right Eye Near:   Left Eye Near:    Bilateral Near:     Physical Exam Vitals and nursing note reviewed.  Constitutional:      General: He is in acute distress.     Appearance: He is ill-appearing.     Comments: In severe pani with exam, I had to wait for Toradol shot to relieve his pain to finish the exam  HENT:     Head: Normocephalic.     Right Ear: External ear normal.     Left Ear: External ear normal.  Eyes:     General: No scleral icterus.    Conjunctiva/sclera: Conjunctivae normal.  Neck:     Comments: Cant rotate to his R due to pain His R sternocleidomastoid muscle and trapezius are tense and very tender.  Pulmonary:     Effort: Pulmonary effort is normal.  Musculoskeletal:        General: Normal range of motion.  Skin:    General: Skin is warm and dry.     Findings: No bruising or rash.  Neurological:     Mental Status: He is alert and oriented to person, place, and time.     Motor: No weakness.     Gait: Gait normal.     Deep Tendon Reflexes: Reflexes abnormal.     Comments: R arm +1/4, L +2/4  Psychiatric:        Mood and Affect: Mood normal.        Behavior: Behavior normal.        Thought Content: Thought content normal.        Judgment: Judgment normal.     UC Treatments / Results  Labs (all labs ordered are listed, but only abnormal results are displayed) Labs Reviewed - No data to display  EKG   Radiology No results found.  Procedures Procedures (including critical care time)  Medications Ordered in UC Medications  ketorolac (TORADOL) injection 60 mg  (60 mg Intramuscular Given 03/29/21 1320)    Initial Impression / Assessment and Plan / UC Course  I have reviewed the triage vital signs and the nursing notes. R cervicalgia with muscle spasm and radicultis He was given Toradol 60 mg IM which helped his pain and could tolerate finishing up his exam I sent more Toradol in oral form and Flexeril as noted. See instructions.  Final Clinical Impressions(s) / UC Diagnoses   Final diagnoses:  Torticollis  Cervical radiculopathy  Sternocleidomastoid muscle tenderness     Discharge Instructions      Follow up with orthopedic doctor this week, specially if you get worse.  Apply heat on the neck that has pain for 15 minutes and do stretches after that.      ED Prescriptions     Medication Sig Dispense Auth. Provider   cyclobenzaprine (FLEXERIL) 10 MG tablet Take 1 tablet (10 mg total) by mouth 2 (two) times daily as needed for muscle spasms. 30 tablet Rodriguez-Southworth, Nettie ElmSylvia, PA-C   ketorolac (TORADOL) 10 MG tablet Take 1 tablet (10 mg total) by mouth every 6 (six) hours as needed. 20 tablet Rodriguez-Southworth, Nettie ElmSylvia, PA-C      PDMP not reviewed this encounter.   Garey HamRodriguez-Southworth, Jerrianne Hartin, [place]-C 03/29/21 1514

## 2021-03-29 NOTE — ED Triage Notes (Signed)
Pt c/o right sided neck pain and shoulder pain. Started about month ago. He states it is starting to keep him up at night. He has taken tylenol and ibuprofen.

## 2021-03-29 NOTE — Discharge Instructions (Addendum)
Follow up with orthopedic doctor this week, specially if you get worse.  Apply heat on the neck that has pain for 15 minutes and do stretches after that.

## 2021-07-05 IMAGING — DX DG KNEE COMPLETE 4+V*L*
5 series · 5 of 5 positions shown · non-contrast
Comparison: July 18, 2019

CLINICAL DATA: Status post trauma.

EXAM:
LEFT KNEE - COMPLETE 4+ VIEW

[knee ap (1 of 2)]
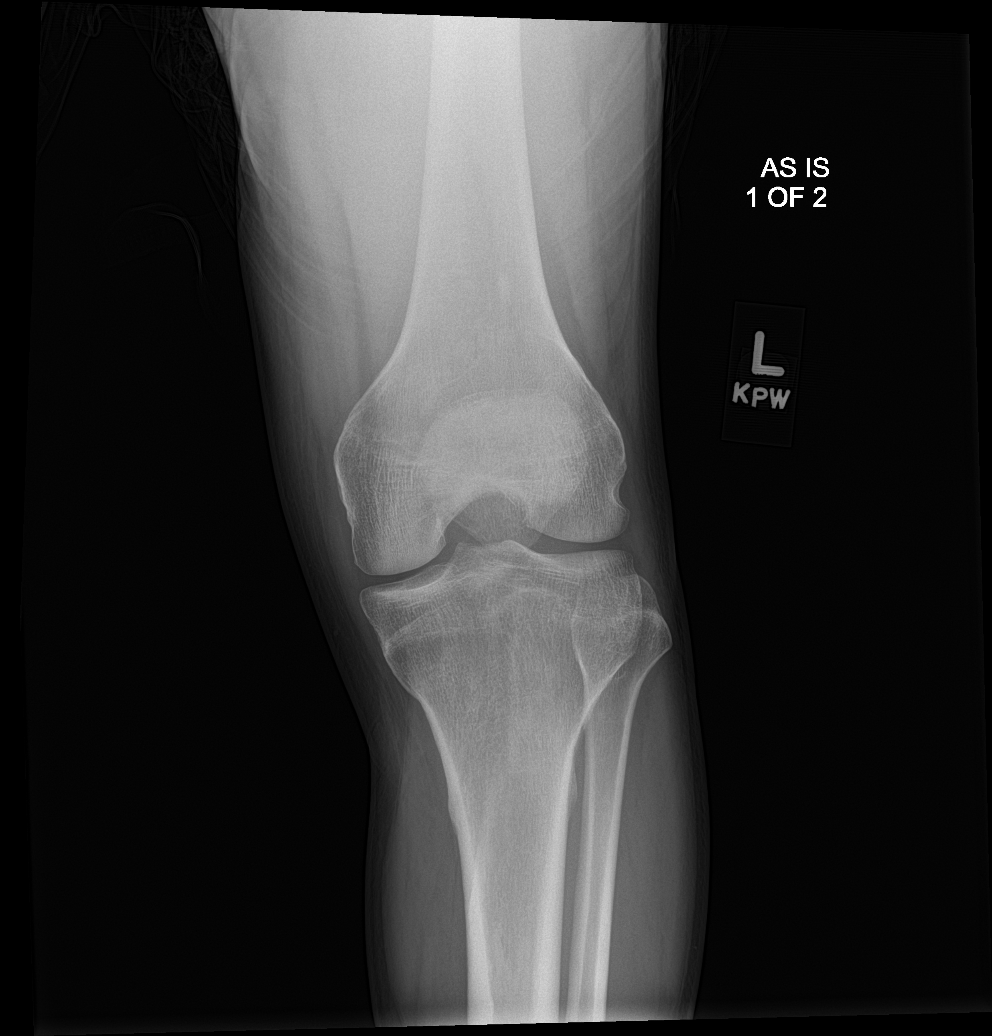

[knee lat]
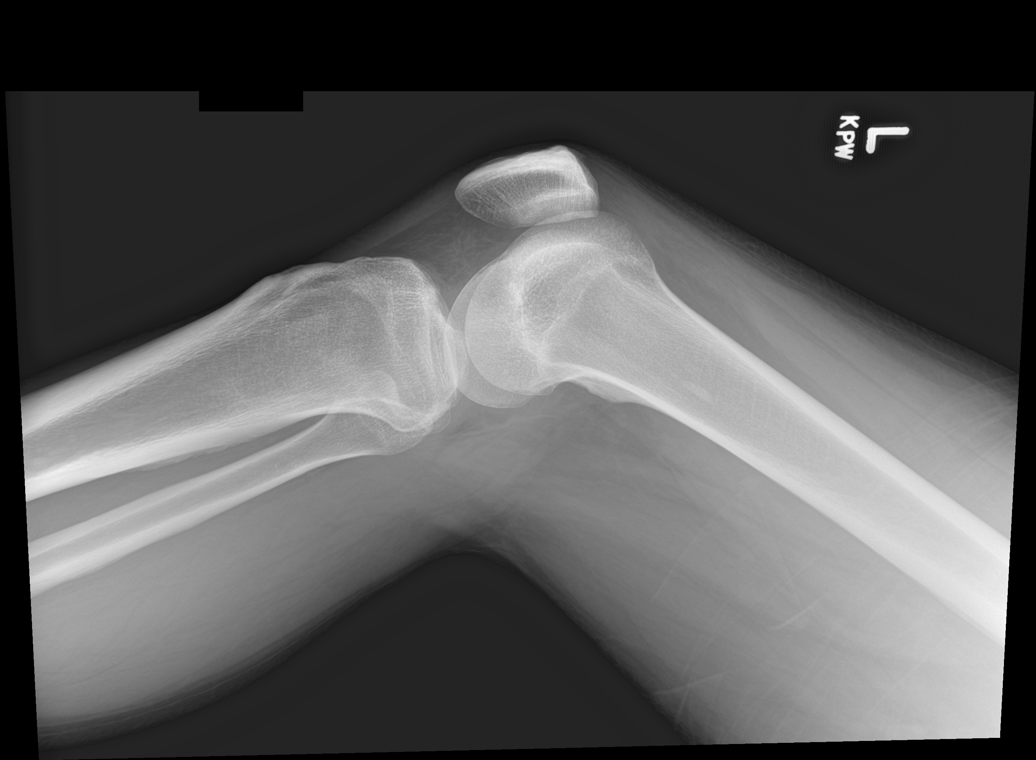

[knee obl (1 of 2)]
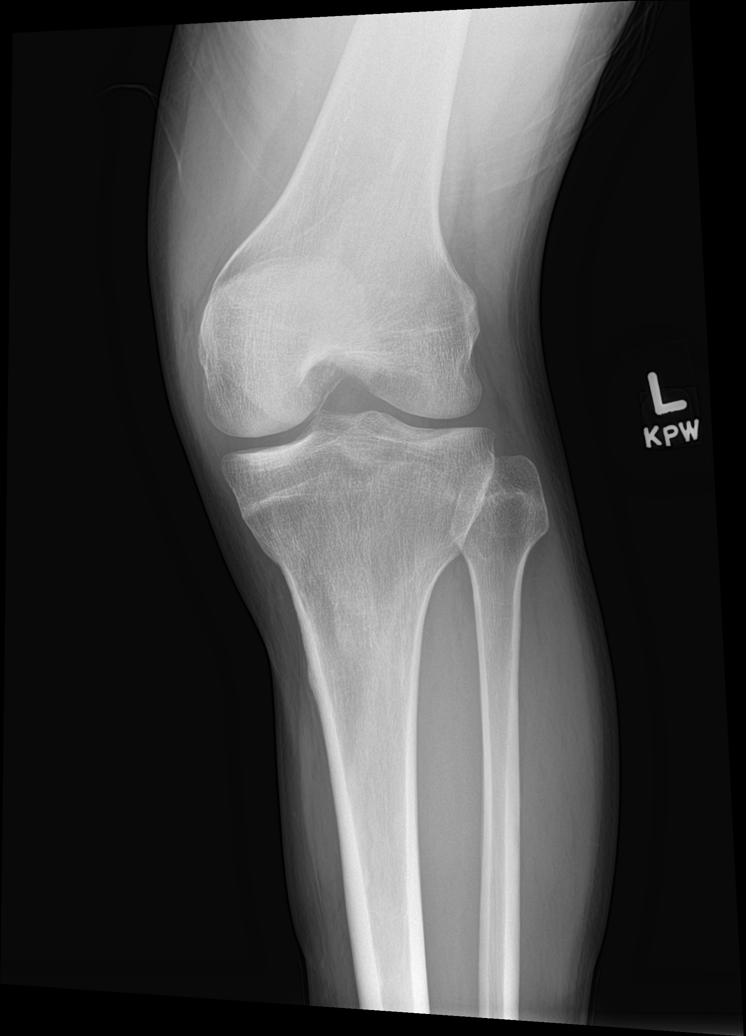

[knee ap (2 of 2)]
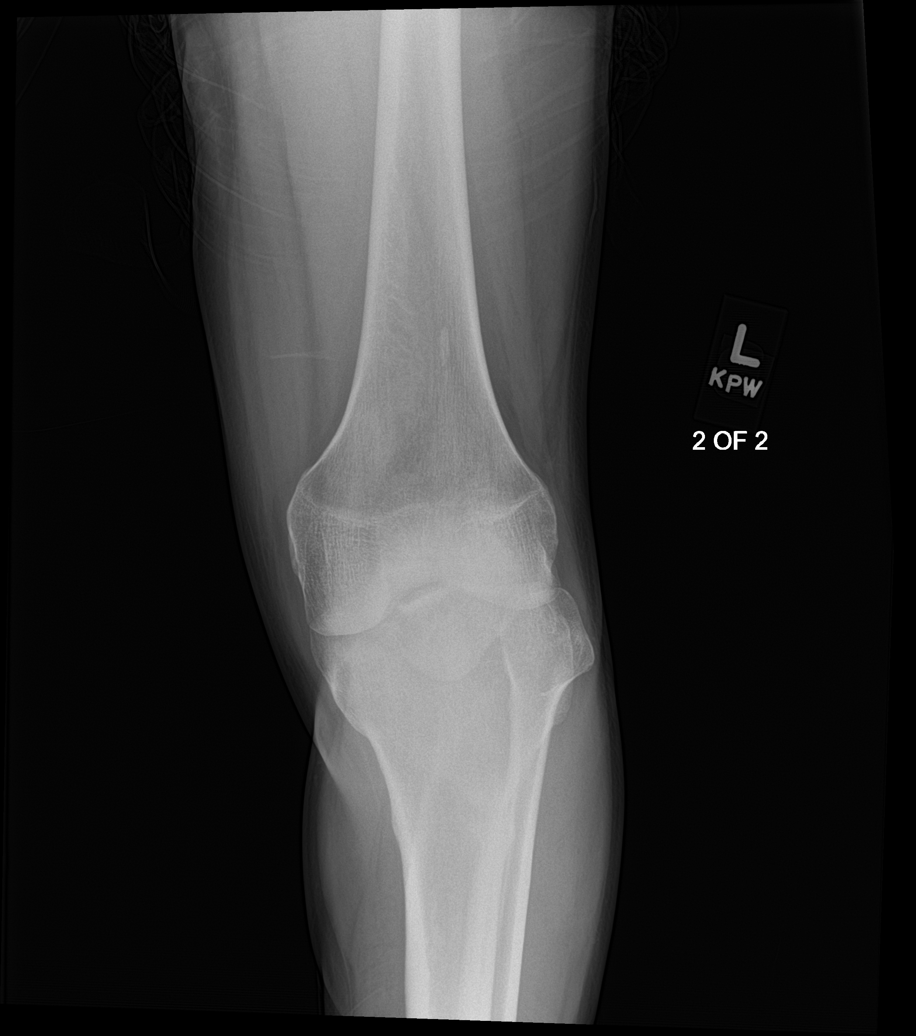

[knee obl (2 of 2)]
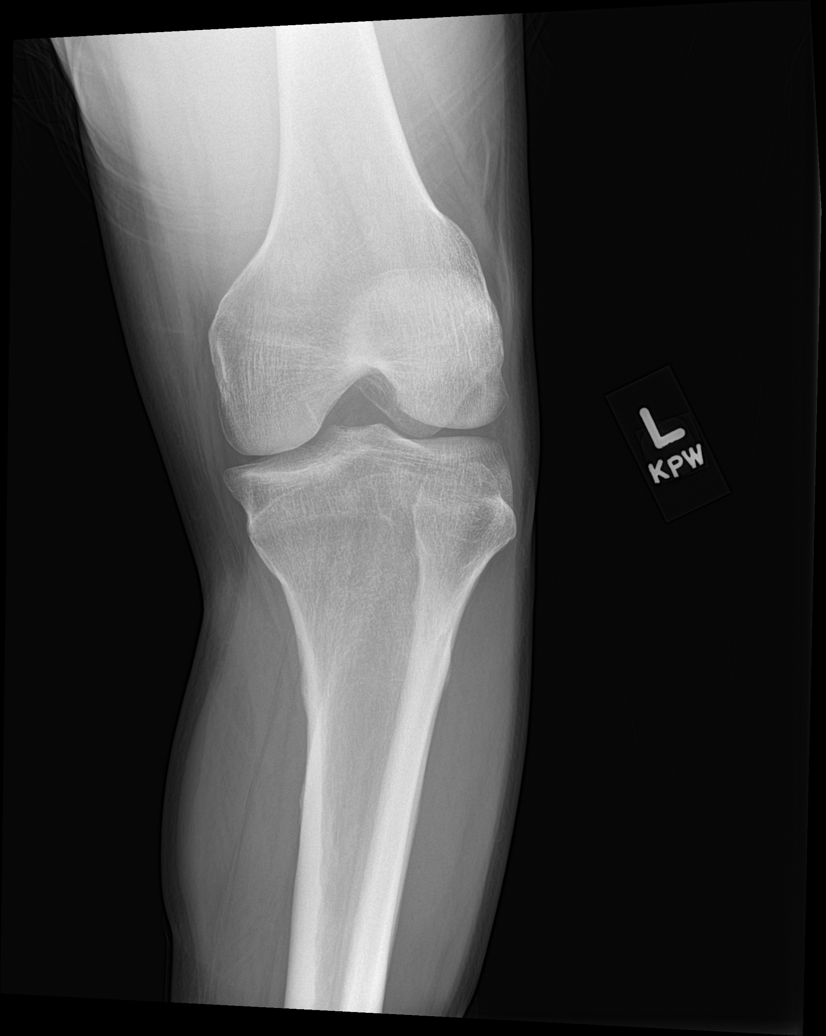

[5 of 5 positions shown; findings below may reference images not displayed]

FINDINGS: No evidence of fracture, dislocation, or joint effusion. No evidence
of arthropathy or other focal bone abnormality. Soft tissues are
unremarkable.
IMPRESSION: Negative.

## 2022-05-06 ENCOUNTER — Ambulatory Visit: Payer: Self-pay

## 2023-06-22 DIAGNOSIS — R202 Paresthesia of skin: Secondary | ICD-10-CM | POA: Diagnosis not present

## 2023-06-22 DIAGNOSIS — M47819 Spondylosis without myelopathy or radiculopathy, site unspecified: Secondary | ICD-10-CM | POA: Diagnosis not present

## 2023-06-22 DIAGNOSIS — M47812 Spondylosis without myelopathy or radiculopathy, cervical region: Secondary | ICD-10-CM | POA: Diagnosis not present

## 2023-06-22 DIAGNOSIS — M4802 Spinal stenosis, cervical region: Secondary | ICD-10-CM | POA: Diagnosis not present

## 2023-06-22 DIAGNOSIS — F17210 Nicotine dependence, cigarettes, uncomplicated: Secondary | ICD-10-CM | POA: Diagnosis not present

## 2023-06-22 DIAGNOSIS — R2689 Other abnormalities of gait and mobility: Secondary | ICD-10-CM | POA: Diagnosis not present

## 2023-06-22 DIAGNOSIS — R200 Anesthesia of skin: Secondary | ICD-10-CM | POA: Diagnosis not present

## 2023-06-22 DIAGNOSIS — M9931 Osseous stenosis of neural canal of cervical region: Secondary | ICD-10-CM | POA: Diagnosis not present

## 2023-06-22 DIAGNOSIS — G9520 Unspecified cord compression: Secondary | ICD-10-CM | POA: Diagnosis not present

## 2023-06-22 DIAGNOSIS — M4800 Spinal stenosis, site unspecified: Secondary | ICD-10-CM | POA: Diagnosis not present

## 2023-06-23 DIAGNOSIS — M47812 Spondylosis without myelopathy or radiculopathy, cervical region: Secondary | ICD-10-CM | POA: Diagnosis not present

## 2023-06-23 DIAGNOSIS — G9520 Unspecified cord compression: Secondary | ICD-10-CM | POA: Diagnosis not present

## 2023-06-23 DIAGNOSIS — M4802 Spinal stenosis, cervical region: Secondary | ICD-10-CM | POA: Diagnosis not present

## 2023-06-29 DIAGNOSIS — G959 Disease of spinal cord, unspecified: Secondary | ICD-10-CM | POA: Diagnosis not present

## 2023-09-28 DIAGNOSIS — M7989 Other specified soft tissue disorders: Secondary | ICD-10-CM | POA: Diagnosis not present

## 2023-09-28 DIAGNOSIS — R32 Unspecified urinary incontinence: Secondary | ICD-10-CM | POA: Diagnosis not present

## 2023-09-28 DIAGNOSIS — F17290 Nicotine dependence, other tobacco product, uncomplicated: Secondary | ICD-10-CM | POA: Diagnosis not present

## 2023-09-28 DIAGNOSIS — G959 Disease of spinal cord, unspecified: Secondary | ICD-10-CM | POA: Diagnosis not present

## 2023-09-28 DIAGNOSIS — Z1211 Encounter for screening for malignant neoplasm of colon: Secondary | ICD-10-CM | POA: Diagnosis not present

## 2023-09-28 DIAGNOSIS — Z13220 Encounter for screening for lipoid disorders: Secondary | ICD-10-CM | POA: Diagnosis not present

## 2023-09-28 DIAGNOSIS — Z716 Tobacco abuse counseling: Secondary | ICD-10-CM | POA: Diagnosis not present

## 2023-09-28 DIAGNOSIS — Z1159 Encounter for screening for other viral diseases: Secondary | ICD-10-CM | POA: Diagnosis not present

## 2023-09-28 DIAGNOSIS — Z720 Tobacco use: Secondary | ICD-10-CM | POA: Diagnosis not present

## 2023-10-05 DIAGNOSIS — M7989 Other specified soft tissue disorders: Secondary | ICD-10-CM | POA: Diagnosis not present

## 2023-10-05 DIAGNOSIS — R1900 Intra-abdominal and pelvic swelling, mass and lump, unspecified site: Secondary | ICD-10-CM | POA: Diagnosis not present

## 2023-10-05 DIAGNOSIS — G959 Disease of spinal cord, unspecified: Secondary | ICD-10-CM | POA: Diagnosis not present

## 2023-10-05 DIAGNOSIS — Z720 Tobacco use: Secondary | ICD-10-CM | POA: Diagnosis not present
